# Patient Record
Sex: Male | Born: 1960 | Race: White | Hispanic: No | Marital: Married | Smoking: Never smoker
Health system: Southern US, Community
[De-identification: ages and names within clinical notes are randomized; demographics above are authoritative.]

## PROBLEM LIST (undated history)

## (undated) DIAGNOSIS — K219 Gastro-esophageal reflux disease without esophagitis: Secondary | ICD-10-CM

## (undated) DIAGNOSIS — R2681 Unsteadiness on feet: Secondary | ICD-10-CM

## (undated) DIAGNOSIS — M549 Dorsalgia, unspecified: Secondary | ICD-10-CM

## (undated) DIAGNOSIS — J309 Allergic rhinitis, unspecified: Secondary | ICD-10-CM

## (undated) DIAGNOSIS — K449 Diaphragmatic hernia without obstruction or gangrene: Secondary | ICD-10-CM

## (undated) DIAGNOSIS — R739 Hyperglycemia, unspecified: Secondary | ICD-10-CM

## (undated) DIAGNOSIS — G8929 Other chronic pain: Secondary | ICD-10-CM

## (undated) DIAGNOSIS — K649 Unspecified hemorrhoids: Secondary | ICD-10-CM

## (undated) DIAGNOSIS — I10 Essential (primary) hypertension: Secondary | ICD-10-CM

## (undated) DIAGNOSIS — R609 Edema, unspecified: Secondary | ICD-10-CM

## (undated) DIAGNOSIS — J301 Allergic rhinitis due to pollen: Secondary | ICD-10-CM

## (undated) HISTORY — DX: Allergic rhinitis due to pollen: J30.1

## (undated) HISTORY — PX: BACK SURGERY: SHX140

## (undated) HISTORY — DX: Hyperglycemia, unspecified: R73.9

## (undated) HISTORY — DX: Essential (primary) hypertension: I10

## (undated) HISTORY — DX: Other chronic pain: G89.29

## (undated) HISTORY — DX: Allergic rhinitis, unspecified: J30.9

## (undated) HISTORY — DX: Diaphragmatic hernia without obstruction or gangrene: K44.9

## (undated) HISTORY — DX: Morbid (severe) obesity due to excess calories: E66.01

## (undated) HISTORY — DX: Edema, unspecified: R60.9

## (undated) HISTORY — DX: Unsteadiness on feet: R26.81

## (undated) HISTORY — DX: Dorsalgia, unspecified: M54.9

## (undated) HISTORY — DX: Gastro-esophageal reflux disease without esophagitis: K21.9

## (undated) HISTORY — DX: Unspecified hemorrhoids: K64.9

---

## 2002-10-17 ENCOUNTER — Ambulatory Visit (HOSPITAL_COMMUNITY): Admission: RE | Admit: 2002-10-17 | Discharge: 2002-10-17 | Payer: Self-pay | Admitting: Family Medicine

## 2002-10-17 ENCOUNTER — Encounter: Payer: Self-pay | Admitting: Family Medicine

## 2004-06-21 ENCOUNTER — Ambulatory Visit: Payer: Self-pay | Admitting: *Deleted

## 2004-06-21 ENCOUNTER — Inpatient Hospital Stay (HOSPITAL_COMMUNITY): Admission: EM | Admit: 2004-06-21 | Discharge: 2004-06-25 | Payer: Self-pay | Admitting: Emergency Medicine

## 2004-06-22 ENCOUNTER — Ambulatory Visit: Payer: Self-pay | Admitting: *Deleted

## 2004-06-24 ENCOUNTER — Ambulatory Visit: Payer: Self-pay | Admitting: Cardiology

## 2004-07-08 ENCOUNTER — Ambulatory Visit (HOSPITAL_COMMUNITY): Admission: RE | Admit: 2004-07-08 | Discharge: 2004-07-08 | Payer: Self-pay | Admitting: Family Medicine

## 2009-03-05 ENCOUNTER — Encounter (INDEPENDENT_AMBULATORY_CARE_PROVIDER_SITE_OTHER): Payer: Self-pay | Admitting: *Deleted

## 2009-03-19 ENCOUNTER — Ambulatory Visit: Payer: Self-pay | Admitting: Internal Medicine

## 2009-03-19 DIAGNOSIS — K921 Melena: Secondary | ICD-10-CM | POA: Insufficient documentation

## 2009-03-19 DIAGNOSIS — K219 Gastro-esophageal reflux disease without esophagitis: Secondary | ICD-10-CM | POA: Insufficient documentation

## 2009-03-19 DIAGNOSIS — R1900 Intra-abdominal and pelvic swelling, mass and lump, unspecified site: Secondary | ICD-10-CM

## 2009-03-31 ENCOUNTER — Ambulatory Visit (HOSPITAL_COMMUNITY): Admission: RE | Admit: 2009-03-31 | Discharge: 2009-03-31 | Payer: Self-pay | Admitting: Internal Medicine

## 2009-03-31 ENCOUNTER — Telehealth (INDEPENDENT_AMBULATORY_CARE_PROVIDER_SITE_OTHER): Payer: Self-pay

## 2009-03-31 ENCOUNTER — Ambulatory Visit: Payer: Self-pay | Admitting: Internal Medicine

## 2009-04-05 ENCOUNTER — Ambulatory Visit (HOSPITAL_COMMUNITY): Admission: RE | Admit: 2009-04-05 | Discharge: 2009-04-05 | Payer: Self-pay | Admitting: Internal Medicine

## 2009-04-06 ENCOUNTER — Encounter: Payer: Self-pay | Admitting: Internal Medicine

## 2009-04-07 ENCOUNTER — Telehealth (INDEPENDENT_AMBULATORY_CARE_PROVIDER_SITE_OTHER): Payer: Self-pay

## 2010-06-16 ENCOUNTER — Encounter: Admission: RE | Admit: 2010-06-16 | Discharge: 2010-06-16 | Payer: Self-pay | Admitting: Internal Medicine

## 2010-08-09 ENCOUNTER — Encounter
Admission: RE | Admit: 2010-08-09 | Discharge: 2010-09-06 | Payer: Self-pay | Source: Home / Self Care | Attending: Internal Medicine | Admitting: Internal Medicine

## 2010-10-04 ENCOUNTER — Ambulatory Visit: Payer: Self-pay | Admitting: Dietician

## 2010-10-11 ENCOUNTER — Ambulatory Visit: Payer: Self-pay | Admitting: Dietician

## 2010-12-20 NOTE — Op Note (Signed)
NAME:  Jesse Meyers, Jesse Meyers                ACCOUNT NO.:  [id number]   MEDICAL RECORD NO.:  [id number]          PATIENT TYPE:  AMB   LOCATION:  DAY                           FACILITY:  [facility]   PHYSICIAN:  R. Roetta Sessions, M.D. DATE OF BIRTH:  [birth year]   DATE OF PROCEDURE:  03/31/2009  DATE OF DISCHARGE:                               OPERATIVE REPORT   PROCEDURE:  Diagnostic colonoscopy.   INDICATIONS FOR PROCEDURE:  [name], now  being evaluated for hematochezia in the setting of constipation.  Colonoscopy is now being done.  Risks, benefits, alternatives, and  limitations have been discussed, questions answered.  He has never had  his lower GI tract evaluated.  Family history is negative for colon  cancer.   Please note, the patient has an abdominal wall mass noted in the office.   Indeed, [name] does have a somewhat ill-defined abdominal wall mass-  like firmness localized just above the umbilicus.  It is not reducible  and it is nontender to palpation.  His abdominal girth makes further  characterization difficult.   PROCEDURE NOTE:  O2 saturation, blood pressure, pulse, and respirations  were monitored throughout the entire procedure.  Conscious sedation:  Versed 5 mg IV, Demerol 100 mg IV in divided doses.  Instrument:  Pentax  video chip system.   FINDINGS:  Digital rectal exam revealed no abnormalities.  Endoscopic  findings:  Prep was adequate.  Colon:  Colonic mucosa was surveyed from  the rectosigmoid junction through the left, transverse, right colon to  the appendiceal orifice, ileocecal valve, and cecum.  These structures  were well seen and photographed for the record.  From this level, the  scope was slowly and cautiously withdrawn.  All previous mentioned  mucosal surfaces were again seen.  The colonic mucosa appeared normal.  The scope was pulled down into the rectum, where thorough examination of  rectal mucosa, including  retroflexed view of the anal verge and en face  view of the anal canal demonstrated friable anal canal tissue and  minimal hemorrhoids only.  The patient tolerated the procedure well.  Cecal withdrawal time 8 minutes.   IMPRESSION:  Minimal anal canal hemorrhoids, friable anal canal,  otherwise normal rectum and colon.   RECOMMENDATIONS:  1. Daily Metamucil or Citrucel fiber supplement, MiraLax 17 grams      orally at bedtime p.r.n. constipation.  2. Ten-day course of Anusol HC suppositories 1 per rectum at bedtime.  3. Pursue abdominal wall mass further.  Will obtain a CT scan.  Will      need to be in a scanner that can accommodate a 382-pound male (BMI      58).  Further recommendations to follow.      Jonathon Bellows, M.D.  Electronically Signed     RMR/MEDQ  D:  03/31/2009  T:  03/31/2009  Job:  [id number]   cc:   Bennett Scrape, MD  Fax: [phone]

## 2010-12-23 NOTE — Discharge Summary (Signed)
NAME:  Jesse Meyers, Jesse Meyers NO.:  [id number]   MEDICAL RECORD NO.:  [id number]          PATIENT TYPE:  INP   LOCATION:  A222                          FACILITY:  [facility]   PHYSICIAN:  Mila Homer. Sudie Bailey, M.D.DATE OF BIRTH:  Aug 11, 1960   DATE OF ADMISSION:  06/21/2004  DATE OF DISCHARGE:  11/19/2005LH                                 DISCHARGE SUMMARY   ADDENDUM:  This patient had a normal dobutamine Cardiolite study and was  slated to go to [facility] for further review including cardiac  catheterization.  Before he left, I had Dr. Upton Bing, cardiologist,  review his entire record.  He felt that the patient's Cardiolite study was  poor quality, and his body habitus may have explained what appeared to be an  IMI.  Further, he had a cardiac echocardiogram Friday, November 18.  Today,  he is due to be transferred, and Dr. Dietrich Pates told me this is of  surprisingly good quality.  There was no sign of any wall-motion  abnormality on this echocardiogram.  Further discussion with the patient  convinced Dr. Dietrich Pates of the fact that he did have an asthma attack, just  more severe than usual.   FINAL DISCHARGE DIAGNOSES:  1.  Chest pain, probably secondary to severe asthma attack.  2.  Asthma.  3.  Morbid obesity.   FOLLOWUP:  He is due for follow up in the office and knows if he has any  further chest pain, to come to me immediately, or if it night or weekend, he  will be seen by whoever is on call for me, or to be seen in the [facility]  emergency room.      SDK/MEDQ  D:  06/25/2004  T:  06/25/2004  Job:  [id number]

## 2010-12-23 NOTE — Procedures (Signed)
NAME:  Jesse Meyers, DIA NO.:  [id number]   MEDICAL RECORD NO.:  [id number]          PATIENT TYPE:  INP   LOCATION:  A222                          FACILITY:  [facility]   PHYSICIAN:  Vida Roller, M.D.   DATE OF BIRTH:  09-28-1960   DATE OF PROCEDURE:  06/22/2004  DATE OF DISCHARGE:                                    STRESS TEST   INDICATIONS FOR PROCEDURE:  [name] is a 50 year old male with no known  coronary artery disease with atypical chest discomfort.  Cardiac risk  factors include unknown lipid status, questionable family history, and  obesity.   BASELINE DATA:  Sinus rhythm at 67 beats per minute with nonspecific ST  abnormalities.  Blood pressure was 122/68.   Dobutamine was infused up to 40 mcg.  The patient was given 0.5 mg of  Atropine IV with the addition of isometric exercise to reach his target  heart rate.  Maximum heart rate achieved was 168 beats per minute, which is  94% of predicted maximum.  Maximum blood pressure was 156/86.  The patient  reported nausea and flush feeling during infusion of Dobutamine which  resolved in recovery with decrease in heart rate.  EKG revealed no ischemic  changes, no arrhythmias were noted.  The patient was given 5 mg of IV  Lopressor secondary to nausea and flushing.  This decreased his heart rate  considerably and relieved his symptoms.   Final images and results are pending M.D. review.  This is a two day study.     Amy   AB/MEDQ  D:  06/22/2004  T:  06/22/2004  Job:  [id number]

## 2010-12-23 NOTE — Consult Note (Signed)
NAME:  Jesse Meyers, Jesse Meyers NO.:  [id number]   MEDICAL RECORD NO.:  [id number]          PATIENT TYPE:  INP   LOCATION:  A222                          FACILITY:  [facility]   PHYSICIAN:  Vida Roller, M.D.   DATE OF BIRTH:  14-Feb-1961   DATE OF CONSULTATION:  06/21/2004  DATE OF DISCHARGE:                                   CONSULTATION   PRIMARY CARE PHYSICIAN:  Dr. Mila Homer. [place].   HISTORY OF PRESENT ILLNESS:  Jesse Meyers is a 50 year old man who has asthma  and a history of severe back injury culminating in surgery in 1993 who  presented to the emergency department at [facility] complaining of  shortness of breath and wheezing associated with some chest pain that  radiated through to his back.  He reports the discomfort to be pleuritic in  nature.  It started about 3:30 in the morning, associated with wheezing, and  he states that it radiates sort of from the anterior portion of his chest  straight back.  He also reports that it will get worse when he exerts  himself, and it gets worse when he leans forward.  He took a sublingual  nitroglycerin and felt better in the emergency department and occasionally  this discomfort will intermittently come back.  He was having a little bit  of the discomfort when he was initially evaluated, but now he is without  discomfort.   PAST MEDICAL HISTORY:  He has a history of asthma which is thought to be  secondary to allergies, and back surgery, and there is a question of whether  he has hypertension, although this has not been treated.   ALLERGIES:  He is not allergic to any medications.   MEDICATIONS:  Benadryl and Tylenol as an outpatient.   SOCIAL HISTORY:  He lives in [place] with his wife.  He is disabled  secondary to his back.  He is married.  He has a son who had some heart  problems at birth.  He does not smoke, drink or use drugs.   FAMILY HISTORY:  His mother died of emphysema in her 93's.  Father is  alive  at age 80 and does not have any heart disease.  He has one brother who is in  his 77's and is healthy.   REVIEW OF SYSTEMS:  Generally negative except for morbid obesity which  causes him to have some challenges when he sleeps including requiring the  need to be propped up when he sleeps with a couple of pillows.  I do not  think this is frank orthopnea; however.   PHYSICAL EXAMINATION:  GENERAL:  He is an obese white male, in no apparent  distress.  VITAL SIGNS:  He is alert and oriented x4.  His blood pressure is 110/68.  Respiratory rate 14, pulse 72.  He weighs 360 pounds.  He is saturating 98%  on room air.  HEENT:  Examination of the head, ears, eyes, nose and throat is  unremarkable.  NECK:  Supple.  There is no jugular venous distension or carotid  bruits.  CHEST:  He has sort of diffuse wheezes, but reasonably good air movement.  He does have some tenderness to palpation on his chest wall in the area that  he has the discomfort.  His cardiac exam is regular with no significant  murmurs.  ABDOMEN:  Soft, obese, nontender.  EXTREMITIES:  Lower extremities are without clubbing, cyanosis or edema.  NEUROLOGIC:  Exam is nonfocal.   LABORATORY DATA:  Chest x-ray shows cardiomegaly, otherwise unremarkable.  Electrocardiogram, sinus rhythm with a mild right axis deviation.  Normal  intervals, normal axes, no ST-T wave changes.  White blood cell count 9.0,  H&H 14 and 43, platelet count 264,000.  Sodium 136, potassium 3.9, chloride  103, bicarbonate 26, BUN 18, creatinine 0.8, and his blood sugar is 92.  Single set of cardiac enzymes is not consistent with acute myocardial  infarction.  Three sets of point of care enzymes are negative for coronary  disease.  His B type  natriuretic peptide is normal.  His D-Dimer is normal.   ASSESSMENT:  This is a gentleman with chest discomfort which is very  atypical for coronary disease, reactive airway disease and morbid obesity.   Obviously, the obesity makes evaluation of his cardiac situation challenging  as does the reactive airway disease.  He will need his reactive airways  disease aggressively treated, and we will leave that to his primary care  physician to provide. I think he needs his cardiac enzymes completed as  cycle of three sets.  If that is negative, I think probably a dobutamine  Cardiolite is the way to go for this gentleman to risk stratify him.  I have  talked to him about weight loss, and we will move forward with this  evaluation from here.     Trey Paula   JH/MEDQ  D:  06/21/2004  T:  06/21/2004  Job:  [id number]

## 2010-12-23 NOTE — Group Therapy Note (Signed)
NAME:  Jesse Meyers, Jesse Meyers NO.:  [id number]   MEDICAL RECORD NO.:  [id number]          PATIENT TYPE:  INP   LOCATION:  A222                          FACILITY:  [facility]   PHYSICIAN:  Mila Homer. Sudie Bailey, M.D.DATE OF BIRTH:  09/18/1960   DATE OF PROCEDURE:  DATE OF DISCHARGE:                                   PROGRESS NOTE   SUBJECTIVE:  He has had no further chest pain.  He is feeling well.   PHYSICAL EXAMINATION:  VITAL SIGNS:  Temperature 97.5, pulse 63, respiratory  rate 24, blood pressure 66.  HEART:  Regular rhythm without murmur, rate of 70.  Heart sounds distant.  LUNGS:  Clear throughout but lung sounds distant.  ABDOMEN:  Morbidly obese without tenderness.   LABORATORY DATA:  Lipid profile showed a cholesterol of 166, triglyceride  110, HDL 42, LDL 102.   ASSESSMENT:  1.  Chest pain, improved, question etiology.  2. Morbid obesity.   PLAN:  He is having the rest of his dobutamine stress test today.  If this  is negative he can go home with further workup outpatient.      SDK/MEDQ  D:  06/23/2004  T:  06/23/2004  Job:  [id number]

## 2010-12-23 NOTE — H&P (Signed)
NAME:  Jesse Meyers, Jesse Meyers NO.:  [id number]   MEDICAL RECORD NO.:  [id number]          PATIENT TYPE:  INP   LOCATION:  A222                          FACILITY:  [facility]   PHYSICIAN:  Mila Homer. Sudie Bailey, M.D.DATE OF BIRTH:  Dec 30, 1960   DATE OF ADMISSION:  06/21/2004  DATE OF DISCHARGE:  LH                                HISTORY & PHYSICAL   This 50 year old woke up from sleep around 3:30 a.m. today with an anterior  chest pressure/discomfort, this persisted, so he came to the emergency room  and received nitroglycerin sublingually which totally cleared up the  discomfort.   He has a history of:  1.  Asthma.  2.  Back surgery.  3.  Obesity.   This is the first time he has had pain like this.  He would get  uncomfortable with his asthma, but the discomfort he had was different than  the discomfort he is having with this.  Admission white cell count was  9,000, H&H 14.3 and 42.6, platelet count 264,000.  His MET-7 was essentially  normal.  His cardiac enzymes were negative.  D-dimmer normal.  He had three  EKGs performed:  the first one at 10:43 a.m. which was felt to be  essentially normal; a second was done at 12:43 p.m. and did show abnormal R  wave progression with what appeared to be Q waves in V2; his third EKG, done  at 1525, was essentially normal except for a sinus arrhythmia.   PHYSICAL EXAMINATION:  GENERAL:  Showed a pleasant, morbidly obese, middle-  aged man who was in no acute distress at the time I examined him later in  the afternoon.  He was well developed.  VITAL SIGNS:  Admission temperature was 97.6, his blood pressure 120/65  __________  cuff, the pulse was 79, and respiratory rate was 16.  O2 sat was  96% on room air.  MENTAL STATUS:  He appeared to be oriented and alert.  HEART:  Regular rhythm, rate of 70.  Heart sounds very distant.  LUNGS:  Clear throughout but distant lung sounds.  ABDOMEN:  Morbidly obese without hepatosplenomegaly or  mass.  There was no  upper abdominal tenderness.  EXTREMITIES:  Trace edema at the ankles.  SKIN:  Color was good.   ADMISSION DIAGNOSES:  1.  Chest pain possibly cardiac in origin.  2.  Morbid obesity.  3.  History of asthma.  4.  History of back surgery.   PLAN OF TREATMENT:  1.  Cardiac workup through [facility].  2.  He is going to be started on ECASA 81 mg every day.  3.  TSH is pending.  4.  He has had an IV of normal saline KVO and on a cardiac monitor.  5.  We will add Protonix 40 mg p.o. every day.  6.  And due for a dobutamine two day study starting tomorrow.  7.  He has been seen by Dr. Dorethea Clan of [facility].      SDK/MEDQ  D:  06/21/2004  T:  06/21/2004  Job:  [id number]

## 2010-12-23 NOTE — Procedures (Signed)
NAME:  Jesse Meyers, Jesse Meyers NO.:  [id number]   MEDICAL RECORD NO.:  [id number]          PATIENT TYPE:  INP   LOCATION:  A222                          FACILITY:  [facility]   PHYSICIAN:  [employer] Bing, M.D.  DATE OF BIRTH:  02-28-1961   DATE OF PROCEDURE:  06/24/2004  DATE OF DISCHARGE:                                  ECHOCARDIOGRAM   REFERRING PHYSICIAN:  Dr. Sudie Bailey.   CLINICAL DATA:  A 50 year old gentleman with chest pain and abnormal  Cardiolite study.   M-MODE:  Aorta 2.8, left atrium 4.9, septum 1.6, posterior wall 1.4, LV  diastole 5.1, LV systole 3.5.   RESULTS:  1.  Technically adequate echocardiographic study.  2.  Marginal left atrial enlargement.  Normal right atrium and right      ventricle.  3.  Normal aortic, mitral, and tricuspid valves.  Pulmonic valve not well      seen, but also appears normal.  4.  Normal Doppler examination.  5.  Normal internal dimension of the left ventricle;  mild to moderate      hypertrophy with disproportionate involvement of the septum.  Normal      regional and global left ventricular systolic function.     Robe   RR/MEDQ  D:  06/24/2004  T:  06/24/2004  Job:  [id number]

## 2010-12-27 NOTE — Discharge Summary (Signed)
NAME:  Jesse Meyers, Jesse Meyers NO.:  [id number]   MEDICAL RECORD NO.:  [id number]          PATIENT TYPE:  INP   LOCATION:  A222                          FACILITY:  [facility]   PHYSICIAN:  Mila Homer. Sudie Bailey, M.D.DATE OF BIRTH:  30-Jun-1961   DATE OF ADMISSION:  06/21/2004  DATE OF DISCHARGE:  11/18/2005LH                                 DISCHARGE SUMMARY   HISTORY OF PRESENT ILLNESS:  A 50 year old who was admitted to the hospital  with chest pain which resolved with nitroglycerin.  He had a benign four day  hospitalization extending from June 21, 2004, to June 24, 2004.   Vital signs remained stable and he was chest pain free during the  hospitalization.   LABORATORY DATA:  His admission CBC, BMP, D-dimer, cardiac markers x3, TSH,  beta natruretic peptide were normal.  His lipid profile showed a total  cholesterol of 166, triglycerides 110, HDL 42, LDL 102.   His admission EKG was normal.  The one two hours later showed what appeared  to be Q's in V2, but this resolved three hours later.  Monitor was normal.   HOSPITAL COURSE:  He was admitted to the hospital, on a cardiac monitor with  vitals q.4h., clear liquid diet, IV normal saline KVO, was seen by [facility].  He had a Dobutamine Cardiolite two day study which was of poor  quality, but significant and moderate sized inferior MI and a possible small  area of anterior ischemia.   Results were discussed with the patient, his wife, and also independently  with the cardiologist on duty, Dr. Dietrich Pates.  This study was poor, but the  patient's dramatic response to nitroglycerin, his risk factors indicate he  would require further workup with cardiac catheterization to be planned at  [facility] in the near future.   FINAL DISCHARGE DIAGNOSES:  1.  Chest pain, possibly due to ischemic heart disease.  2.  Morbid obesity.   DISPOSITION:  As above.   FOLLOWUP:  In our office.      SDK/MEDQ  D:   06/24/2004  T:  06/24/2004  Job:  [id number]

## 2011-06-03 ENCOUNTER — Emergency Department (HOSPITAL_COMMUNITY): Payer: Medicare Other

## 2011-06-03 ENCOUNTER — Encounter: Payer: Self-pay | Admitting: Emergency Medicine

## 2011-06-03 ENCOUNTER — Emergency Department (HOSPITAL_COMMUNITY)
Admission: EM | Admit: 2011-06-03 | Discharge: 2011-06-03 | Disposition: A | Discharge status: Home / Self Care | Payer: Medicare Other | Attending: Emergency Medicine | Admitting: Emergency Medicine

## 2011-06-03 DIAGNOSIS — M545 Low back pain, unspecified: Secondary | ICD-10-CM | POA: Insufficient documentation

## 2011-06-03 DIAGNOSIS — M549 Dorsalgia, unspecified: Secondary | ICD-10-CM

## 2011-06-03 LAB — URINALYSIS, ROUTINE W REFLEX MICROSCOPIC
Bilirubin Urine: NEGATIVE
Glucose, UA: NEGATIVE mg/dL
Ketones, ur: NEGATIVE mg/dL
Leukocytes, UA: NEGATIVE
Nitrite: NEGATIVE
Protein, ur: NEGATIVE mg/dL
Specific Gravity, Urine: 1.02 (ref 1.005–1.030)
Urobilinogen, UA: 0.2 mg/dL (ref 0.0–1.0)
pH: 6 (ref 5.0–8.0)

## 2011-06-03 LAB — URINE MICROSCOPIC-ADD ON

## 2011-06-03 MED ORDER — OXYCODONE-ACETAMINOPHEN 5-325 MG PO TABS: 1.0000 | ORAL_TABLET | ORAL | Status: AC | PRN

## 2011-06-03 NOTE — ED Notes (Signed)
Pt c/o pain to left lower back. Pt states he was getting out of his truck on Monday and twisted his back.

## 2011-06-03 NOTE — ED Provider Notes (Signed)
History     CSN: [id number] Arrival date & time: 06/03/2011 11:09 AM   First MD Initiated Contact with Patient 06/03/11 1056      Chief Complaint  Patient presents with  . Back Pain    (Consider location/radiation/quality/duration/timing/severity/associated sxs/prior treatment) HPI Comments: Patient c/o left lower back pain for 5 days.  States the pain began when he stepped out of his truck and twisted his back.  Felt a sharp pain to his back.  He denies fall.  Was seen at the local urgent care and given prednisone and muscle relaxer to which he states is not helping the pain.  He denies dysuria, incontinence, numbness or weakness.  Patient is a 50 y.o. male presenting with back pain. The history is provided by the patient.  Back Pain  This is a new problem. The current episode started more than 2 days ago. The problem occurs constantly. The problem has not changed since onset.The pain is associated with twisting. The pain is present in the lumbar spine and sacro-iliac joint. The pain radiates to the left thigh (left buttocks). The pain is moderate. The symptoms are aggravated by bending, twisting and certain positions. The pain is the same all the time. Pertinent negatives include no chest pain, no fever, no numbness, no abdominal pain, no abdominal swelling, no bowel incontinence, no perianal numbness, no dysuria and no weakness. He has tried muscle relaxants and NSAIDs for the symptoms. The treatment provided no relief. Risk factors include obesity.    History reviewed. No pertinent past medical history.  Past Surgical History  Procedure Date  . Back surgery     History reviewed. No pertinent family history.  History  Substance Use Topics  . Smoking status: Never Smoker   . Smokeless tobacco: Not on file  . Alcohol Use: No      Review of Systems  Constitutional: Negative for fever, chills and fatigue.  HENT: Negative for sore throat, trouble swallowing, neck pain and neck  stiffness.   Respiratory: Negative for shortness of breath.   Cardiovascular: Negative for chest pain and palpitations.  Gastrointestinal: Negative for nausea, vomiting, abdominal pain, blood in stool and bowel incontinence.  Genitourinary: Negative for dysuria, hematuria, flank pain, penile pain and testicular pain.  Musculoskeletal: Positive for back pain. Negative for myalgias, joint swelling, arthralgias and gait problem.  Skin: Negative for rash.  Neurological: Negative for dizziness, weakness and numbness.  Hematological: Does not bruise/bleed easily.  All other systems reviewed and are negative.    Allergies  Review of patient's allergies indicates no known allergies.  Home Medications   Current Outpatient Rx  Name Route Sig Dispense Refill  . CYCLOBENZAPRINE HCL 10 MG PO TABS Oral Take 10 mg by mouth 3 (three) times daily.      . IBUPROFEN 200 MG PO TABS Oral Take 200-800 mg by mouth every 6 (six) hours as needed. For back pain     . PREDNISONE (PAK) 10 MG PO TABS Oral Take 10-60 mg by mouth daily. Started dose pack 06/02/11       BP 124/64  Pulse 69  Temp(Src) 98.3 F (36.8 C) (Oral)  Resp 18  Ht 5\' 8"  (1.727 m)  Wt 341 lb (154.677 kg)  BMI 51.85 kg/m2  SpO2 99%  Physical Exam  Nursing note and vitals reviewed. Constitutional: He is oriented to person, place, and time. He appears well-developed and well-nourished. No distress.  HENT:  Head: Normocephalic and atraumatic.  Mouth/Throat: Oropharynx is clear and moist.  Neck: Normal range of motion. Neck supple.  Cardiovascular: Normal rate, regular rhythm and normal heart sounds.   Pulmonary/Chest: Effort normal and breath sounds normal. No respiratory distress. He exhibits no tenderness.  Abdominal: Soft. He exhibits no distension. There is no tenderness.  Musculoskeletal: Normal range of motion. He exhibits tenderness. He exhibits no edema.       Lumbar back: He exhibits tenderness. He exhibits normal range of  motion, no bony tenderness, no swelling, no edema, no deformity, no spasm and normal pulse.       ttp of the left lower lumbar paraspinal muscles and left SI joint space.  Well healed surgical scar over the lumbar spine  Lymphadenopathy:    He has no cervical adenopathy.  Neurological: He is alert and oriented to person, place, and time. No cranial nerve deficit or sensory deficit. He exhibits normal muscle tone. Coordination and gait normal.  Reflex Scores:      Patellar reflexes are 2+ on the right side and 2+ on the left side.      Achilles reflexes are 2+ on the right side and 2+ on the left side. Skin: Skin is warm and dry.    ED Course  Procedures (including critical care time)  Labs Reviewed  URINALYSIS, ROUTINE W REFLEX MICROSCOPIC - Abnormal; Notable for the following:    Hgb urine dipstick SMALL (*)    All other components within normal limits  URINE MICROSCOPIC-ADD ON   Dg Lumbar Spine Complete  06/03/2011  *RADIOLOGY REPORT*  Clinical Data: Low back pain x 5 days, prior lumbar surgery  LUMBAR SPINE - COMPLETE 4+ VIEW  Comparison: CT abdomen/pelvis dated 04/05/2009  Findings: There are five lumbar-type vertebral bodies.  No evidence of fracture or dislocation.  Vertebral body heights are maintained.  Multilevel degenerative changes.  Prior posterior lumbar fixation at L5-S1. Fracture of the left S1 pedicle screw, new.  IMPRESSION: Prior posterior lumbar fixation at L5-S1.  Fracture of the left S1 pedicle screw, new.  Original Report Authenticated By: Charline Bills, M.D.        MDM   Patient ambulated to the restroom w/o difficulty.  No focal neuro deficits on exam.  ttp of the left lumbar paraspinal muscles.     1:30 PM consulted Dr. Channing Mutters.  He states that the pedicle screw fx that was seen on todays x-ray is old.  He will see pt in his office on Monday for follow-up.    OUTPATIENT MEDICATIONS PRESCRIBED FROM THE ED:   New Prescriptions   OXYCODONE-ACETAMINOPHEN  (PERCOCET) 5-325 MG PER TABLET    Take 1 tablet by mouth every 4 (four) hours as needed for pain.       Marky Buresh L. [place], [place] 06/03/11 2052

## 2011-06-03 NOTE — ED Notes (Signed)
Pt a/ox4. Resp even and unlabored. NAD at this time.  D/C instructions reviewed with pt. Pt verbalized understanding. Rx reviewed as well. Pt ambulated to POV with steady gate.

## 2011-06-04 NOTE — ED Provider Notes (Signed)
Medical screening examination/treatment/procedure(s) were performed by non-physician practitioner and as supervising physician I was immediately available for consultation/collaboration.  Doug Sou, MD 06/04/11 [phone]

## 2011-07-19 ENCOUNTER — Other Ambulatory Visit: Payer: Self-pay | Admitting: Neurosurgery

## 2011-07-19 DIAGNOSIS — M4316 Spondylolisthesis, lumbar region: Secondary | ICD-10-CM

## 2011-07-25 ENCOUNTER — Ambulatory Visit
Admission: RE | Admit: 2011-07-25 | Discharge: 2011-07-25 | Disposition: A | Discharge status: Home / Self Care | Payer: Medicare Other | Source: Ambulatory Visit | Attending: Neurosurgery | Admitting: Neurosurgery

## 2011-07-25 DIAGNOSIS — M4316 Spondylolisthesis, lumbar region: Secondary | ICD-10-CM

## 2011-11-11 IMAGING — CR DG TIBIA/FIBULA 2V*L*
2 series · 2 of 2 positions shown · non-contrast
Comparison: None.

CLINICAL DATA: Left lower extremity pain and swelling.  No known
injury.

LEFT TIBIA AND FIBULA - 2 VIEW

[view not recorded (1 of 2)]
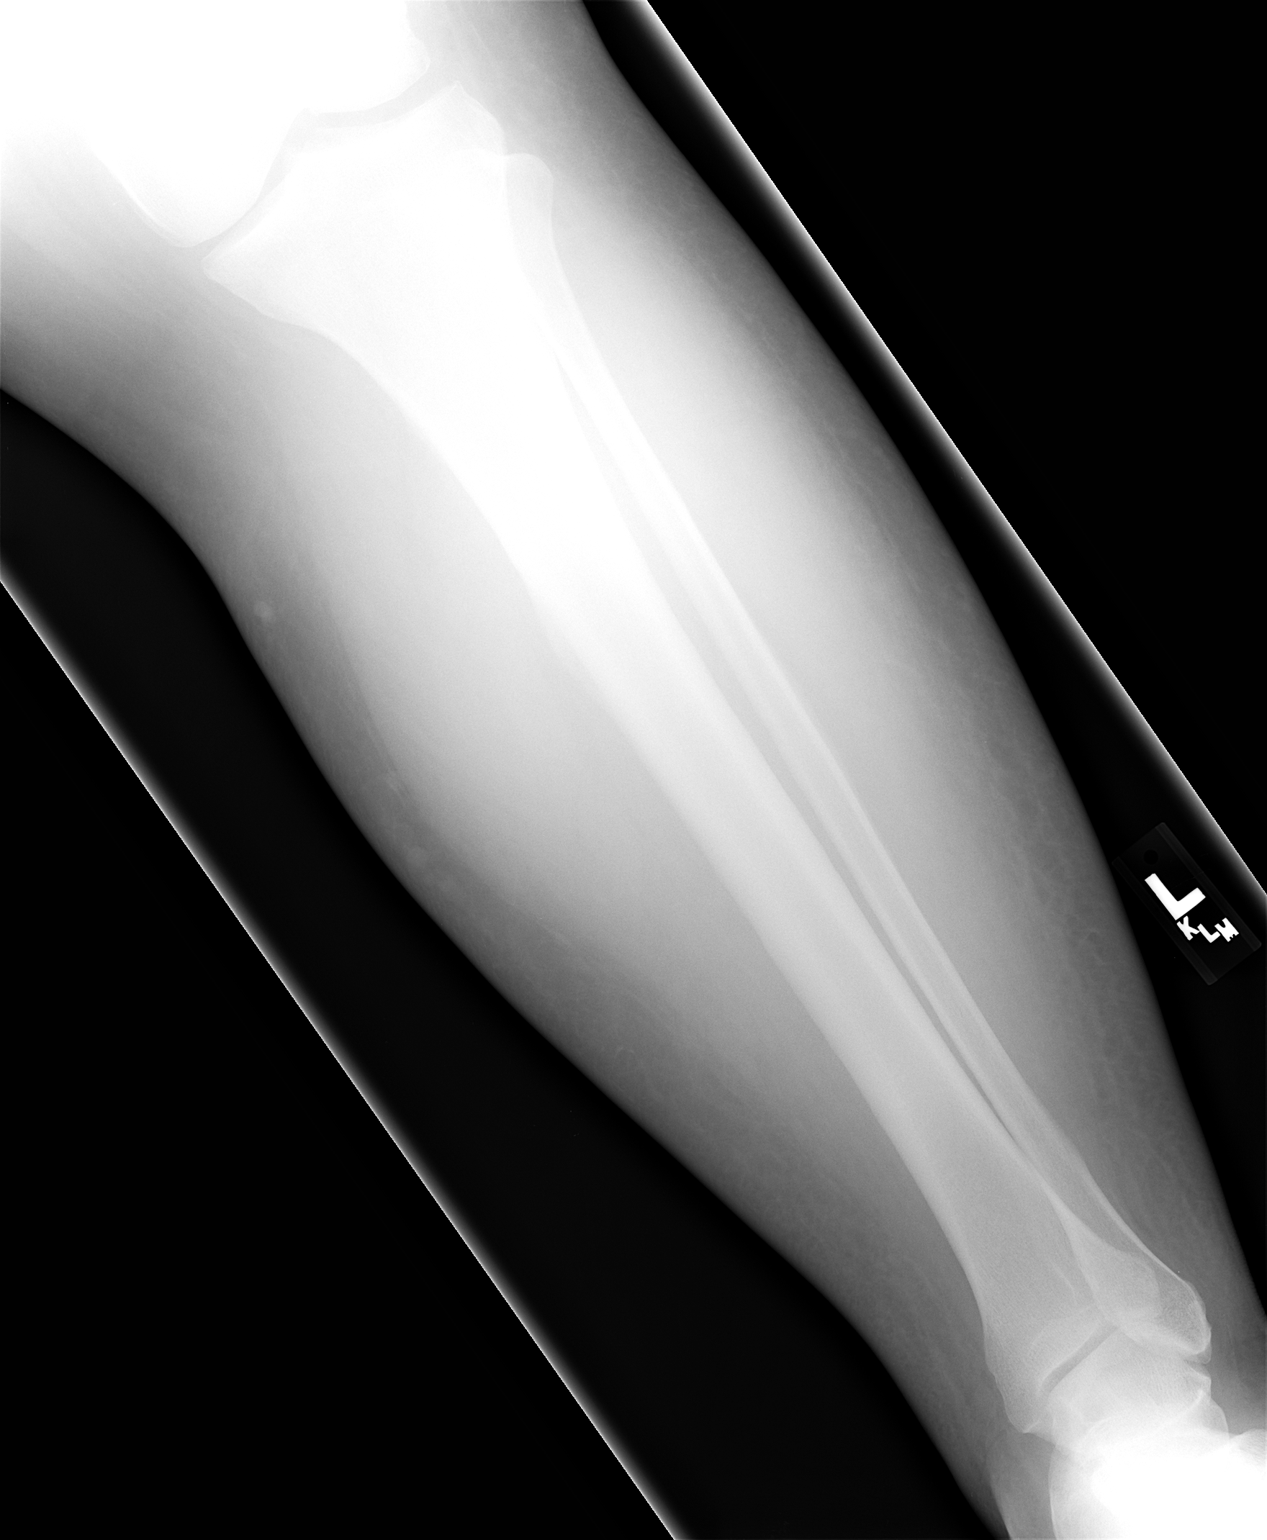

[view not recorded (2 of 2)]
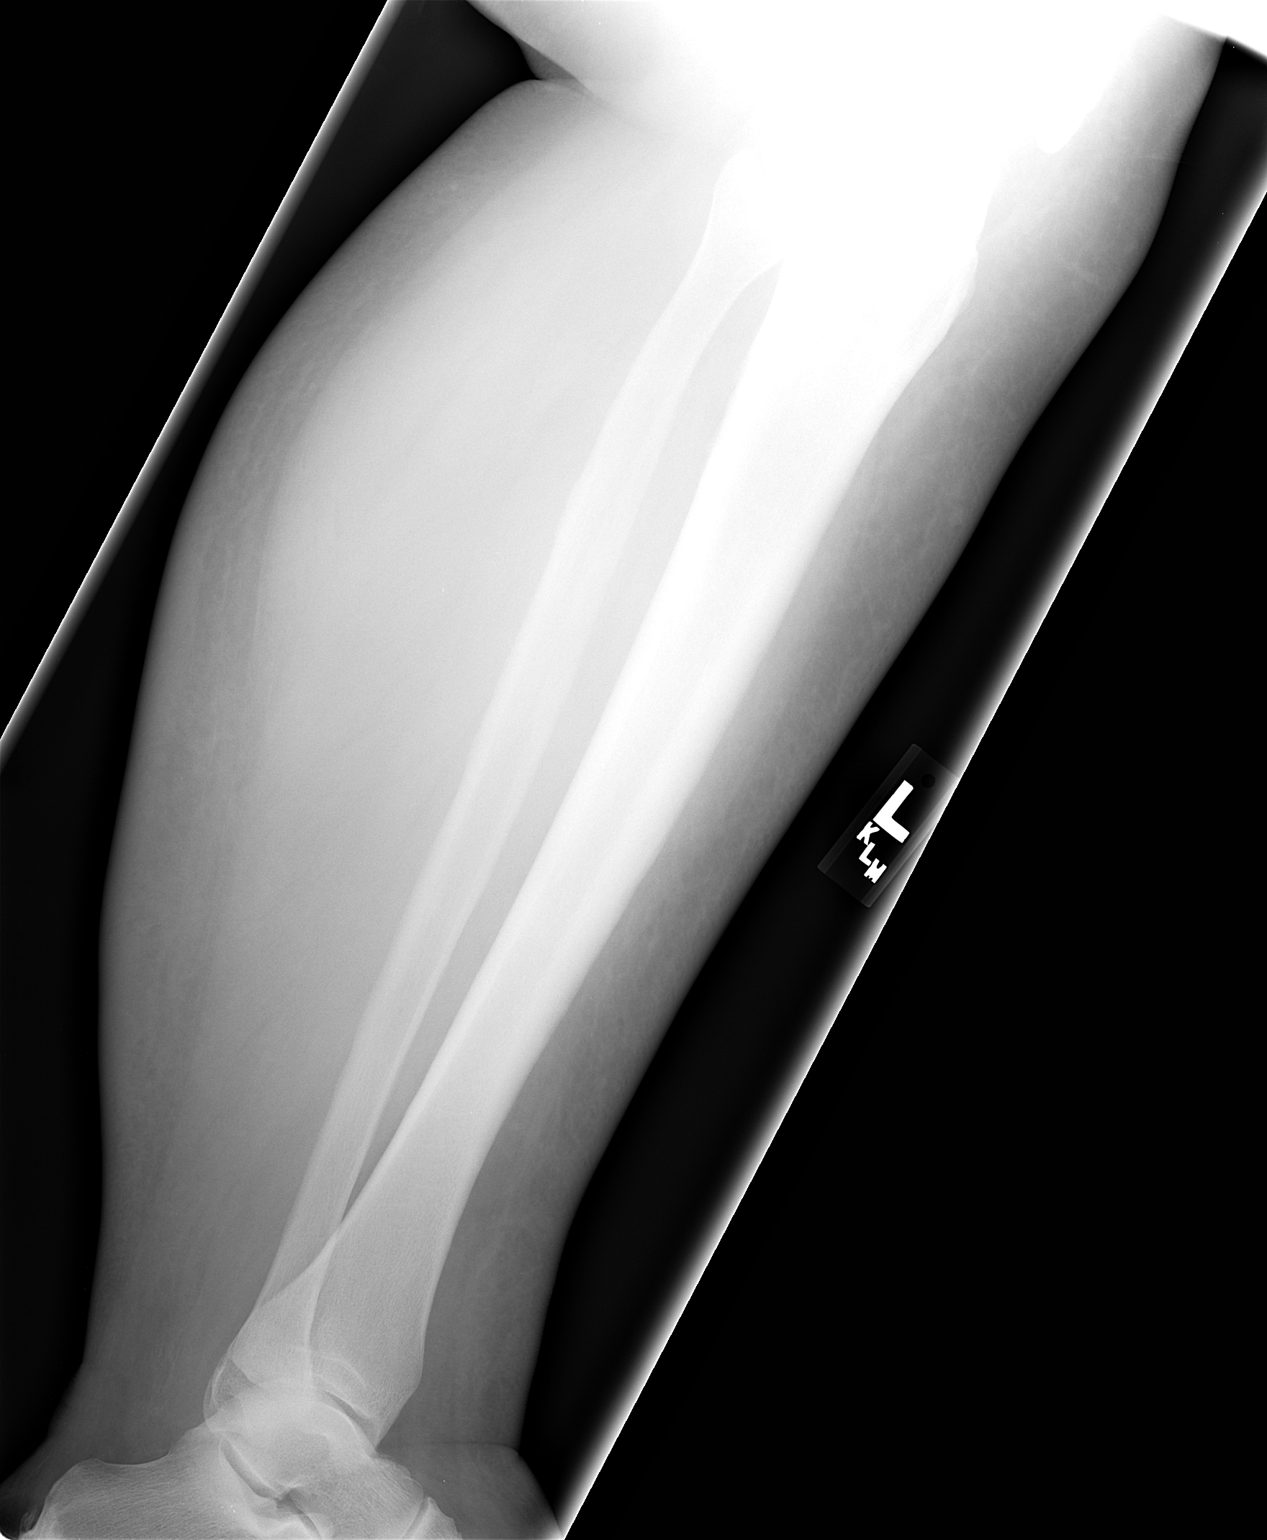

[2 of 2 positions shown; findings below may reference images not displayed]

FINDINGS: The soft tissues are diffusely prominent and possibly
swollen.  No soft tissue emphysema, foreign body, acute fracture,
dislocation or bone destruction is demonstrated.
IMPRESSION: Possible diffuse lower extremity soft tissue edema- nonspecific.
No osseous abnormality demonstrated.

## 2013-04-14 ENCOUNTER — Emergency Department (HOSPITAL_COMMUNITY): Payer: Medicare Other

## 2013-04-14 ENCOUNTER — Emergency Department (HOSPITAL_COMMUNITY)
Admission: EM | Admit: 2013-04-14 | Discharge: 2013-04-14 | Disposition: A | Discharge status: Home / Self Care | Payer: Medicare Other | Attending: Emergency Medicine | Admitting: Emergency Medicine

## 2013-04-14 ENCOUNTER — Encounter (HOSPITAL_COMMUNITY): Payer: Self-pay | Admitting: *Deleted

## 2013-04-14 DIAGNOSIS — J209 Acute bronchitis, unspecified: Secondary | ICD-10-CM | POA: Insufficient documentation

## 2013-04-14 DIAGNOSIS — R609 Edema, unspecified: Secondary | ICD-10-CM | POA: Insufficient documentation

## 2013-04-14 MED ORDER — PREDNISONE 50 MG PO TABS
60.0000 mg | ORAL_TABLET | Freq: Once | ORAL | Status: AC
  Administered 2013-04-14: 60 mg via ORAL
  Filled 2013-04-14: qty 1

## 2013-04-14 MED ORDER — ALBUTEROL SULFATE (5 MG/ML) 0.5% IN NEBU
2.5000 mg | INHALATION_SOLUTION | Freq: Once | RESPIRATORY_TRACT | Status: AC
  Administered 2013-04-14: 2.5 mg via RESPIRATORY_TRACT
  Filled 2013-04-14: qty 0.5

## 2013-04-14 MED ORDER — PREDNISONE 50 MG PO TABS: 50.0000 mg | ORAL_TABLET | Freq: Every day | ORAL | Status: DC

## 2013-04-14 MED ORDER — IPRATROPIUM BROMIDE 0.02 % IN SOLN
0.5000 mg | Freq: Once | RESPIRATORY_TRACT | Status: AC
  Administered 2013-04-14: 0.5 mg via RESPIRATORY_TRACT
  Filled 2013-04-14: qty 2.5

## 2013-04-14 MED ORDER — ALBUTEROL SULFATE HFA 108 (90 BASE) MCG/ACT IN AERS
2.0000 | INHALATION_SPRAY | RESPIRATORY_TRACT | Status: DC | PRN
  Filled 2013-04-14: qty 6.7

## 2013-04-14 NOTE — ED Notes (Signed)
C/o cold symptoms since Sat., felt chest feeling tight today

## 2013-04-14 NOTE — ED Provider Notes (Signed)
Scribed for Dione Booze, MD, the patient was seen in room APA10/APA10. This chart was scribed by Lewanda Rife, ED scribe. Patient's care was started at 1810  CSN: [id number]     Arrival date & time 04/14/13  1746 History   First MD Initiated Contact with Patient 04/14/13 1804     Chief Complaint  Patient presents with  . Wheezing   (Consider location/radiation/quality/duration/timing/severity/associated sxs/prior Treatment) The history is provided by the patient.   HPI Comments: Jesse Meyers is a 52 y.o. male who presents to the Emergency Department complaining of constant moderate "congestion in chest" onset today. Reports associated mild chills, shortness of breath, non-productive cough, and chronic edema in lower legs. Reports symptoms are exacerbated on exertion and alleviated by nothing. Denies associated fever, nausea, orthopnea, chest pain, and emesis. Denies smoking or drinking alcohol. Reports hx of similar symptoms. Denies having a PCP. History reviewed. No pertinent past medical history. Past Surgical History  Procedure Laterality Date  . Back surgery     No family history on file. History  Substance Use Topics  . Smoking status: Never Smoker   . Smokeless tobacco: Not on file  . Alcohol Use: No    Review of Systems  Respiratory: Positive for wheezing.    A complete 10 system review of systems was obtained and all systems are negative except as noted in the HPI and PMH.    Allergies  Review of patient's allergies indicates no known allergies.  Home Medications   Current Outpatient Rx  Name  Route  Sig  Dispense  Refill  . cyclobenzaprine (FLEXERIL) 10 MG tablet   Oral   Take 10 mg by mouth 3 (three) times daily.           Marland Kitchen ibuprofen (ADVIL,MOTRIN) 200 MG tablet   Oral   Take 200-800 mg by mouth every 6 (six) hours as needed. For back pain          . predniSONE (STERAPRED UNI-PAK) 10 MG tablet   Oral   Take 10-60 mg by mouth daily. Started dose  pack 06/02/11           BP 134/76  Pulse 86  Temp(Src) 99.7 F (37.6 C) (Oral)  Resp 24  Ht 5\' 4"  (1.626 m)  Wt 350 lb (158.759 kg)  BMI 60.05 kg/m2  SpO2 100% Physical Exam  Nursing note and vitals reviewed. Constitutional: He is oriented to person, place, and time. He appears well-developed and well-nourished. No distress.  Morbidly obese   HENT:  Head: Normocephalic and atraumatic.  Eyes: EOM are normal.  Neck: Neck supple. No tracheal deviation present.  Cardiovascular: Normal rate, regular rhythm and normal heart sounds.   Pulmonary/Chest: Effort normal. No respiratory distress. He has wheezes (mild diffuse ).  Abdominal: Soft.  Musculoskeletal: Normal range of motion. He exhibits edema (2 + lower extremities ).  Mild venous stasis changes in both legs  Neurological: He is alert and oriented to person, place, and time.  Skin: Skin is warm and dry.  Psychiatric: He has a normal mood and affect. His behavior is normal.    ED Course  Procedures (including critical care time)\ Medications  albuterol (PROVENTIL) (5 MG/ML) 0.5% nebulizer solution 2.5 mg (2.5 mg Nebulization Given 04/14/13 1821)  ipratropium (ATROVENT) nebulizer solution 0.5 mg (0.5 mg Nebulization Given 04/14/13 1821)  predniSONE (DELTASONE) tablet 60 mg (60 mg Oral Given 04/14/13 1822)   Imaging Review Dg Chest 2 View  04/14/2013   *RADIOLOGY REPORT*  Clinical Data: Wheezing.  Chest congestion  CHEST - 2 VIEW  Comparison: 07/08/2004  Findings: Heart size is normal.  No pleural effusion or edema. Mild plate-like atelectasis noted in the lung bases.  No airspace consolidation.  IMPRESSION:  1.  Bibasilar atelectasis. 2.  No pneumonia.   Original Report Authenticated By: Signa Kell, M.D.    MDM   1. Acute bronchitis    Wheezing and cough consistent with bronchitis. He'll be treated with albuterol with ipratropium and given a dose of prednisone. Chest x-ray will be obtained to rule out pneumonia.  7:16  PM He got significant relief with albuterol with ipratropium. Chest x-ray does not show pneumonia. On exam, he still has scattered wheezes and breathing treatment will be repeated.  8:02 PM Following a second nebulizer treatment, lungs are completely clear and patient states he feels like he is back to normal. He is discharged with an albuterol inhaler and a prescription for prednisone.   I personally performed the services described in this documentation, which was scribed in my presence. The recorded information has been reviewed and is accurate.     Dione Booze, MD 04/14/13 2003

## 2013-04-14 NOTE — ED Notes (Signed)
Chest congestion with wheezing and non-productive cough x 2-3 days.  No audible wheezing in triage.

## 2013-04-14 NOTE — ED Notes (Signed)
Respiratory paged and aware of neb treatment

## 2013-10-14 ENCOUNTER — Encounter: Payer: Self-pay | Admitting: *Deleted

## 2013-10-15 ENCOUNTER — Ambulatory Visit: Payer: Self-pay | Admitting: Nurse Practitioner

## 2014-01-12 ENCOUNTER — Ambulatory Visit: Payer: Self-pay | Admitting: Internal Medicine

## 2014-05-11 ENCOUNTER — Ambulatory Visit: Payer: Self-pay | Admitting: Internal Medicine

## 2014-08-20 DIAGNOSIS — M4316 Spondylolisthesis, lumbar region: Secondary | ICD-10-CM | POA: Diagnosis not present

## 2014-08-20 DIAGNOSIS — M4716 Other spondylosis with myelopathy, lumbar region: Secondary | ICD-10-CM | POA: Diagnosis not present

## 2015-04-13 ENCOUNTER — Encounter: Payer: Self-pay | Admitting: Nurse Practitioner

## 2015-04-13 ENCOUNTER — Ambulatory Visit (INDEPENDENT_AMBULATORY_CARE_PROVIDER_SITE_OTHER): Payer: Commercial Managed Care - HMO | Admitting: Nurse Practitioner

## 2015-04-13 VITALS — BP 146/78 | HR 85 | Temp 97.1°F | Resp 22 | Ht 65.55 in | Wt >= 6400 oz

## 2015-04-13 DIAGNOSIS — R739 Hyperglycemia, unspecified: Secondary | ICD-10-CM | POA: Diagnosis not present

## 2015-04-13 DIAGNOSIS — G8929 Other chronic pain: Secondary | ICD-10-CM | POA: Diagnosis not present

## 2015-04-13 DIAGNOSIS — M545 Low back pain: Secondary | ICD-10-CM

## 2015-04-13 DIAGNOSIS — I10 Essential (primary) hypertension: Secondary | ICD-10-CM | POA: Diagnosis not present

## 2015-04-13 DIAGNOSIS — J309 Allergic rhinitis, unspecified: Secondary | ICD-10-CM

## 2015-04-13 NOTE — Progress Notes (Signed)
Patient ID: Jesse Meyers, male   DOB: 11-30-1960, 54 y.o.   MRN: [id number]    PCP: Trey Sailors, MD  Allergies  Allergen Reactions  . Other     Perfumes, cats    Chief Complaint  Patient presents with  . Establish Care  . Medical Management of Chronic Issues     HPI: Patient is a 54 y.o. male seen in the office today to re-estalbish care- pt reports he was a patient here many years ago ~5 years ago. Has not seen anyone routinely since.  Has had ruptured disc--back surgery, rods and pins. Dr Trey Sailors in [place] who follows him. Orthopedic prescribes percocet.  No other specialist.  Unsure of when he had colonoscopy- possible when he was 51, 52. No family hx of colon cancer.  Doing well, has had a cold for a few days. Hx of allergies.  No routine exercise  No special diet.  Has already eaten this morning. Advanced Directive information Does patient have an advance directive?: No, Would patient like information on creating an advanced directive?: Yes - Educational materials given Review of Systems:  Review of Systems  Constitutional: Negative for activity change, appetite change, fatigue and unexpected weight change.  HENT: Positive for congestion and rhinorrhea. Negative for hearing loss and postnasal drip.   Eyes: Negative.   Respiratory: Negative for cough and shortness of breath.   Cardiovascular: Positive for leg swelling (in left leg). Negative for chest pain and palpitations.  Gastrointestinal: Negative for abdominal pain, diarrhea and constipation.  Genitourinary: Negative for dysuria and difficulty urinating.  Musculoskeletal: Positive for myalgias and arthralgias.       Chronic pain, chronic. Does not take pain medication daily  Skin: Negative for color change and wound.  Allergic/Immunologic: Positive for environmental allergies.  Neurological: Positive for numbness (and tingling in bilateral leg). Negative for dizziness and weakness.  Psychiatric/Behavioral: Negative  for behavioral problems, confusion and agitation.    Past Medical History  Diagnosis Date  . Morbid obesity   . Chronic back pain 1993 &1995    s/p back surgery  . GERD (gastroesophageal reflux disease)   . Allergic rhinitis   . Edema   . Unsteady gait   . Hiatal hernia   . Hypertension   . Hyperglycemia   . Hemorrhoids   . Allergic rhinitis due to pollen    Past Surgical History  Procedure Laterality Date  . Back surgery  1993 &1995     Dr Channing Mutters   Social History:   reports that he has never smoked. He does not have any smokeless tobacco history on file. He reports that he does not drink alcohol or use illicit drugs.  Family History  Problem Relation Age of Onset  . COPD Mother   . Diabetes Mother   . Cancer Father 31    LT breast   . Hypertension Father   . Heart disease Father     Medications: Patient's Medications  New Prescriptions   No medications on file  Previous Medications   IBUPROFEN (ADVIL,MOTRIN) 200 MG TABLET    Take 200 mg by mouth. Take 1 or 2 times a week   OXYCODONE-ACETAMINOPHEN (PERCOCET) 10-325 MG PER TABLET    Take 1 tablet by mouth every other day.  Modified Medications   No medications on file  Discontinued Medications   No medications on file     Physical Exam:  Filed Vitals:   04/13/15 0901  BP: 146/78  Pulse: 85  Temp:  97.1 F (36.2 C)  TempSrc: Oral  Resp: 22  Height: 5' 5.55" (1.665 m)  Weight: 430 lb 9.6 oz (195.319 kg)  SpO2: 96%  Body mass index is 70.46 kg/(m^2).    Physical Exam  Constitutional: He is oriented to person, place, and time. He appears well-developed and well-nourished. No distress.  Obese male  HENT:  Head: Normocephalic and atraumatic.  Eyes: Conjunctivae and EOM are normal. Pupils are equal, round, and reactive to light.  Neck: Normal range of motion. Neck supple.  Cardiovascular: Normal rate, regular rhythm and normal heart sounds.   Pulmonary/Chest: Effort normal and breath sounds normal.    Abdominal: Soft. Bowel sounds are normal.  Musculoskeletal: He exhibits no edema or tenderness.  Neurological: He is alert and oriented to person, place, and time.  Skin: Skin is warm and dry. He is not diaphoretic.  Psychiatric: He has a normal mood and affect.    Labs reviewed: Basic Metabolic Panel: No results for input(s): NA, K, CL, CO2, GLUCOSE, BUN, CREATININE, CALCIUM, MG, PHOS, TSH in the last 8760 hours. Liver Function Tests: No results for input(s): AST, ALT, ALKPHOS, BILITOT, PROT, ALBUMIN in the last 8760 hours. No results for input(s): LIPASE, AMYLASE in the last 8760 hours. No results for input(s): AMMONIA in the last 8760 hours. CBC: No results for input(s): WBC, NEUTROABS, HGB, HCT, MCV, PLT in the last 8760 hours. Lipid Panel: No results for input(s): CHOL, HDL, LDLCALC, TRIG, CHOLHDL, LDLDIRECT in the last 8760 hours. TSH: No results for input(s): TSH in the last 8760 hours. A1C: No results found for: HGBA1C   Assessment/Plan 1. Essential hypertension -taking some OTC he is unsure of what it is, discussing avoiding OTC decongestants that will make blood pressure worse -discussed diet, low sodium diet, hand out given  -to start exercise, slow regimen and increase as tolerates - Comprehensive metabolic panel; Future - Lipid panel; Future - CBC With Differential; Future  2. Allergic rhinitis, unspecified allergic rhinitis type -increased allergy symptoms at this time -to use Claritin daily, also can use plain nasal saline if needed  3. OBESITY, MORBID -discussed weight loss, diet modifications and exercise -will get lab work prior to next visit  4. Hyperglycemia -pt taking blood sugars with wife diabetic meter, ranging from 100-130 fasting -encouraged low sugar diet - Comprehensive metabolic panel; Future  5. Chronic low back pain -pain remains stable, being managed by ortho  Follow up in 4 weeks for EV with fasting lab work prior to visit.  Janene Harvey. Biagio Borg  [facility] [phone] 8 am - 5 pm) [phone] (after hours)

## 2015-04-13 NOTE — Patient Instructions (Signed)
Bring all over the counter medication that you are taking with you to your next visit.   Make sure you are not taking any over the counter medication with "d" or anything with sudafed, Pseudoephedrine, phenylephrine  Low sodium diet, blood pressure is on the high side may need to start medication at next visit  Exercise 30 mins 5 days a week is goal, start with 10 mins a day-- get into a routine.

## 2015-04-17 ENCOUNTER — Encounter (HOSPITAL_COMMUNITY): Payer: Self-pay | Admitting: Emergency Medicine

## 2015-04-17 ENCOUNTER — Emergency Department (HOSPITAL_COMMUNITY)
Admission: EM | Admit: 2015-04-17 | Discharge: 2015-04-17 | Disposition: A | Discharge status: Home / Self Care | Payer: Medicare HMO | Attending: Emergency Medicine | Admitting: Emergency Medicine

## 2015-04-17 ENCOUNTER — Emergency Department (HOSPITAL_COMMUNITY): Payer: Medicare HMO

## 2015-04-17 DIAGNOSIS — G8929 Other chronic pain: Secondary | ICD-10-CM | POA: Diagnosis not present

## 2015-04-17 DIAGNOSIS — J069 Acute upper respiratory infection, unspecified: Secondary | ICD-10-CM | POA: Diagnosis not present

## 2015-04-17 DIAGNOSIS — R0602 Shortness of breath: Secondary | ICD-10-CM | POA: Diagnosis not present

## 2015-04-17 DIAGNOSIS — J9801 Acute bronchospasm: Secondary | ICD-10-CM | POA: Diagnosis not present

## 2015-04-17 DIAGNOSIS — I10 Essential (primary) hypertension: Secondary | ICD-10-CM | POA: Insufficient documentation

## 2015-04-17 DIAGNOSIS — Z8719 Personal history of other diseases of the digestive system: Secondary | ICD-10-CM | POA: Diagnosis not present

## 2015-04-17 MED ORDER — ALBUTEROL SULFATE HFA 108 (90 BASE) MCG/ACT IN AERS: 1.0000 | INHALATION_SPRAY | Freq: Four times a day (QID) | RESPIRATORY_TRACT | Status: DC | PRN

## 2015-04-17 MED ORDER — DOXYCYCLINE HYCLATE 100 MG PO CAPS: 100.0000 mg | ORAL_CAPSULE | Freq: Two times a day (BID) | ORAL | Status: DC

## 2015-04-17 MED ORDER — BENZONATATE 100 MG PO CAPS: 100.0000 mg | ORAL_CAPSULE | Freq: Three times a day (TID) | ORAL | Status: DC

## 2015-04-17 MED ORDER — ALBUTEROL SULFATE (2.5 MG/3ML) 0.083% IN NEBU
2.5000 mg | INHALATION_SOLUTION | Freq: Once | RESPIRATORY_TRACT | Status: AC
  Administered 2015-04-17: 2.5 mg via RESPIRATORY_TRACT
  Filled 2015-04-17: qty 3

## 2015-04-17 MED ORDER — PREDNISONE 50 MG PO TABS
60.0000 mg | ORAL_TABLET | Freq: Once | ORAL | Status: AC
  Administered 2015-04-17: 20:00:00 60 mg via ORAL
  Filled 2015-04-17 (×2): qty 1

## 2015-04-17 MED ORDER — PREDNISONE 20 MG PO TABS: 20.0000 mg | ORAL_TABLET | Freq: Two times a day (BID) | ORAL | Status: DC

## 2015-04-17 MED ORDER — ALBUTEROL (5 MG/ML) CONTINUOUS INHALATION SOLN
10.0000 mg/h | INHALATION_SOLUTION | Freq: Once | RESPIRATORY_TRACT | Status: AC
  Administered 2015-04-17: 10 mg/h via RESPIRATORY_TRACT
  Filled 2015-04-17: qty 20

## 2015-04-17 MED ORDER — IPRATROPIUM-ALBUTEROL 0.5-2.5 (3) MG/3ML IN SOLN
3.0000 mL | Freq: Once | RESPIRATORY_TRACT | Status: AC
  Administered 2015-04-17: 3 mL via RESPIRATORY_TRACT
  Filled 2015-04-17: qty 3

## 2015-04-17 NOTE — ED Provider Notes (Signed)
CSN: [id number]     Arrival date & time 04/17/15  1909 History   First MD Initiated Contact with Patient 04/17/15 1918     Chief Complaint  Patient presents with  . Shortness of Breath      HPI  Patient presents for evaluation of "I can't catch my breath". History of visit with his primary care physician on Monday. Had nasal congestion and discharge. Was told it was likely allergies. Offered over-the-counter medications. Over the last 2 days has developed worsening cough with wheezing and feeling of breathlessness and presents here. No chest pain. No peripheral edema. No fevers. Cough productive of green sputum. No history of heart disease. No history of congestive heart failure. No history of COPD or bronchospasm. He is a lifetime nonsmoker.  Past Medical History  Diagnosis Date  . Morbid obesity   . Chronic back pain 1993 &1995    s/p back surgery  . GERD (gastroesophageal reflux disease)   . Allergic rhinitis   . Edema   . Unsteady gait   . Hiatal hernia   . Hypertension   . Hyperglycemia   . Hemorrhoids   . Allergic rhinitis due to pollen    Past Surgical History  Procedure Laterality Date  . Back surgery  1993 &1995     Dr Channing Mutters   Family History  Problem Relation Age of Onset  . COPD Mother   . Diabetes Mother   . Cancer Father 62    LT breast   . Hypertension Father   . Heart disease Father    Social History  Substance Use Topics  . Smoking status: Never Smoker   . Smokeless tobacco: Never Used  . Alcohol Use: No    Review of Systems  Constitutional: Negative for fever, chills, diaphoresis, appetite change and fatigue.  HENT: Negative for mouth sores, sore throat and trouble swallowing.   Eyes: Negative for visual disturbance.  Respiratory: Positive for cough, shortness of breath and wheezing. Negative for chest tightness.   Cardiovascular: Negative for chest pain.  Gastrointestinal: Negative for nausea, vomiting, abdominal pain, diarrhea and abdominal  distention.  Endocrine: Negative for polydipsia, polyphagia and polyuria.  Genitourinary: Negative for dysuria, frequency and hematuria.  Musculoskeletal: Negative for gait problem.  Skin: Negative for color change, pallor and rash.  Neurological: Negative for dizziness, syncope, light-headedness and headaches.  Hematological: Does not bruise/bleed easily.  Psychiatric/Behavioral: Negative for behavioral problems and confusion.      Allergies  Other  Home Medications   Prior to Admission medications   Medication Sig Start Date End Date Taking? Authorizing Provider  ibuprofen (ADVIL,MOTRIN) 200 MG tablet Take 400-600 mg by mouth every 6 (six) hours as needed for mild pain.    Yes Historical Provider, MD  albuterol (PROVENTIL HFA;VENTOLIN HFA) 108 (90 BASE) MCG/ACT inhaler Inhale 1-2 puffs into the lungs every 6 (six) hours as needed for wheezing. 04/17/15   Rolland Porter, MD  benzonatate (TESSALON) 100 MG capsule Take 1 capsule (100 mg total) by mouth every 8 (eight) hours. 04/17/15   Rolland Porter, MD  doxycycline (VIBRAMYCIN) 100 MG capsule Take 1 capsule (100 mg total) by mouth 2 (two) times daily. 04/17/15   Rolland Porter, MD  predniSONE (DELTASONE) 20 MG tablet Take 1 tablet (20 mg total) by mouth 2 (two) times daily with a meal. 04/17/15   Rolland Porter, MD   BP 123/56 mmHg  Pulse 93  Temp(Src) 98.2 F (36.8 C) (Oral)  Resp 15  Ht  (1.727  m)  Wt 410 lb (185.975 kg)  BMI 62.35 kg/m2  SpO2 92% Physical Exam  Constitutional: He is oriented to person, place, and time. He appears well-developed and well-nourished. No distress.  HENT:  Head: Normocephalic.  Eyes: Conjunctivae are normal. Pupils are equal, round, and reactive to light. No scleral icterus.  Neck: Normal range of motion. Neck supple. No thyromegaly present.  Cardiovascular: Normal rate and regular rhythm.  Exam reveals no gallop and no friction rub.   No murmur heard. Pulmonary/Chest: Effort normal and breath sounds  normal. No respiratory distress. He has no wheezes. He has no rales.  Globally diminished breath sounds. Diffuse wheezing in all fields. Prolongation.  Abdominal: Soft. Bowel sounds are normal. He exhibits no distension. There is no tenderness. There is no rebound.  Musculoskeletal: Normal range of motion.  Neurological: He is alert and oriented to person, place, and time.  Skin: Skin is warm and dry. No rash noted.  Psychiatric: He has a normal mood and affect. His behavior is normal.    ED Course  Procedures (including critical care time) Labs Review Labs Reviewed - No data to display  Imaging Review No results found. I have personally reviewed and evaluated these images and lab results as part of my medical decision-making.   EKG Interpretation None      MDM   Final diagnoses:  URI (upper respiratory infection)  Bronchospasm    Given 1 hour continuous nebulized albuterol. Prednisone. Was doing considerably better. Minimal residual wheezing. Given additional neb and clears. Chest x-ray shows no acute abnormalities. Clinically and radiographically is not in congestive heart failure. Has no pain no fever and is not hypoxemic. Appropriate for outpatient treatment. Plan prednisone, Tessalon, albuterol MDI, doxycycline, primary care follow-up.  CRITICAL CARE Performed by: Rolland Porter JOSEPH   Total critical care time: 60 minutes continuous nebulized albuterol multiple re-evaluations.  Critical care time was exclusive of separately billable procedures and treating other patients.  Critical care was necessary to treat or prevent imminent or life-threatening deterioration.  Critical care was time spent personally by me on the following activities: development of treatment plan with patient and/or surrogate as well as nursing, discussions with consultants, evaluation of patient's response to treatment, examination of patient, obtaining history from patient or surrogate, ordering  and performing treatments and interventions, ordering and review of laboratory studies, ordering and review of radiographic studies, pulse oximetry and re-evaluation of patient's condition. care    Rolland Porter, MD 04/17/15 2131

## 2015-04-17 NOTE — ED Notes (Signed)
Pt reports he is feeling better, breathing easier. Family at bedside.

## 2015-04-17 NOTE — ED Notes (Signed)
Pt to be discharged post neb tx.

## 2015-04-17 NOTE — Discharge Instructions (Signed)
Bronchospasm °A bronchospasm is a spasm or tightening of the airways going into the lungs. During a bronchospasm breathing becomes more difficult because the airways get smaller. When this happens there can be coughing, a whistling sound when breathing (wheezing), and difficulty breathing. Bronchospasm is often associated with asthma, but not all patients who experience a bronchospasm have asthma. °CAUSES  °A bronchospasm is caused by inflammation or irritation of the airways. The inflammation or irritation may be triggered by:  °· Allergies (such as to animals, pollen, food, or mold). Allergens that cause bronchospasm may cause wheezing immediately after exposure or many hours later.   °· Infection. Viral infections are believed to be the most common cause of bronchospasm.   °· Exercise.   °· Irritants (such as pollution, cigarette smoke, strong odors, aerosol sprays, and paint fumes).   °· Weather changes. Winds increase molds and pollens in the air. Rain refreshes the air by washing irritants out. Cold air may cause inflammation.   °· Stress and emotional upset.   °SIGNS AND SYMPTOMS  °· Wheezing.   °· Excessive nighttime coughing.   °· Frequent or severe coughing with a simple cold.   °· Chest tightness.   °· Shortness of breath.   °DIAGNOSIS  °Bronchospasm is usually diagnosed through a history and physical exam. Tests, such as chest X-rays, are sometimes done to look for other conditions. °TREATMENT  °· Inhaled medicines can be given to open up your airways and help you breathe. The medicines can be given using either an inhaler or a nebulizer machine. °· Corticosteroid medicines may be given for severe bronchospasm, usually when it is associated with asthma. °HOME CARE INSTRUCTIONS  °· Always have a plan prepared for seeking medical care. Know when to call your health care provider and local emergency services (911 in the [place] Know where you can access local emergency care. °· Only take medicines as  directed by your health care provider. °· If you were prescribed an inhaler or nebulizer machine, ask your health care provider to explain how to use it correctly. Always use a spacer with your inhaler if you were given one. °· It is necessary to remain calm during an attack. Try to relax and breathe more slowly.  °· Control your home environment in the following ways:   °¨ Change your heating and air conditioning filter at least once a month.   °¨ Limit your use of fireplaces and wood stoves. °¨ Do not smoke and do not allow smoking in your home.   °¨ Avoid exposure to perfumes and fragrances.   °¨ Get rid of pests (such as roaches and mice) and their droppings.   °¨ Throw away plants if you see mold on them.   °¨ Keep your house clean and dust free.   °¨ Replace carpet with wood, tile, or vinyl flooring. Carpet can trap dander and dust.   °¨ Use allergy-proof pillows, mattress covers, and box spring covers.   °¨ Wash bed sheets and blankets every week in hot water and dry them in a dryer.   °¨ Use blankets that are made of polyester or cotton.   °¨ Wash hands frequently. °SEEK MEDICAL CARE IF:  °· You have muscle aches.   °· You have chest pain.   °· The sputum changes from clear or white to yellow, green, gray, or bloody.   °· The sputum you cough up gets thicker.   °· There are problems that may be related to the medicine you are given, such as a rash, itching, swelling, or trouble breathing.   °SEEK IMMEDIATE MEDICAL CARE IF:  °· You have worsening wheezing and coughing even   after taking your prescribed medicines.   °· You have increased difficulty breathing.   °· You develop severe chest pain. °MAKE SURE YOU:  °· Understand these instructions. °· Will watch your condition. °· Will get help right away if you are not doing well or get worse. °Document Released: 07/27/2003 Document Revised: 07/29/2013 Document Reviewed: 01/13/2013 °ExitCare® Patient Information ©2015 [employer]. This information is not  intended to replace advice given to you by your health care provider. Make sure you discuss any questions you have with your health care provider. °Upper Respiratory Infection, Adult °An upper respiratory infection (URI) is also known as the common cold. It is often caused by a type of germ (virus). Colds are easily spread (contagious). You can pass it to others by kissing, coughing, sneezing, or drinking out of the same glass. Usually, you get better in 1 or 2 weeks.  °HOME CARE  °· Only take medicine as told by your doctor. °· Use a warm mist humidifier or breathe in steam from a hot shower. °· Drink enough water and fluids to keep your pee (urine) clear or pale yellow. °· Get plenty of rest. °· Return to work when your temperature is back to normal or as told by your doctor. You may use a face mask and wash your hands to stop your cold from spreading. °GET HELP RIGHT AWAY IF:  °· After the first few days, you feel you are getting worse. °· You have questions about your medicine. °· You have chills, shortness of breath, or brown or red spit (mucus). °· You have yellow or brown snot (nasal discharge) or pain in the face, especially when you bend forward. °· You have a fever, puffy (swollen) neck, pain when you swallow, or white spots in the back of your throat. °· You have a bad headache, ear pain, sinus pain, or chest pain. °· You have a high-pitched whistling sound when you breathe in and out (wheezing). °· You have a lasting cough or cough up blood. °· You have sore muscles or a stiff neck. °MAKE SURE YOU:  °· Understand these instructions. °· Will watch your condition. °· Will get help right away if you are not doing well or get worse. °Document Released: 01/10/2008 Document Revised: 10/16/2011 Document Reviewed: 10/29/2013 °ExitCare® Patient Information ©2015 [employer]. This information is not intended to replace advice given to you by your health care provider. Make sure you discuss any questions you have  with your health care provider. ° °

## 2015-04-17 NOTE — ED Notes (Signed)
RT contacted for neb tx.

## 2015-04-17 NOTE — ED Notes (Signed)
Pt saw the doctor on Monday for head cold. States he has gotten worse this weak and today he has started wheezing and harder to "catch breath"

## 2015-04-27 ENCOUNTER — Telehealth: Payer: Self-pay | Admitting: *Deleted

## 2015-04-27 ENCOUNTER — Other Ambulatory Visit: Payer: Self-pay | Admitting: Nurse Practitioner

## 2015-04-27 DIAGNOSIS — M545 Low back pain, unspecified: Secondary | ICD-10-CM | POA: Insufficient documentation

## 2015-04-27 DIAGNOSIS — G8929 Other chronic pain: Secondary | ICD-10-CM | POA: Insufficient documentation

## 2015-04-27 NOTE — Telephone Encounter (Signed)
Patient called and stated that Shanda Bumps was going to do a referral for him to see Dr. Channing Mutters for his back. Patient stated that he has an appointment tomorrow at 9:15 and needs this referral. Please Advise.

## 2015-04-27 NOTE — Telephone Encounter (Signed)
Referral made 

## 2015-04-28 DIAGNOSIS — M4316 Spondylolisthesis, lumbar region: Secondary | ICD-10-CM | POA: Diagnosis not present

## 2015-05-10 ENCOUNTER — Other Ambulatory Visit: Payer: Commercial Managed Care - HMO

## 2015-05-13 ENCOUNTER — Encounter: Payer: Commercial Managed Care - HMO | Admitting: Nurse Practitioner

## 2015-06-04 ENCOUNTER — Other Ambulatory Visit: Payer: Commercial Managed Care - HMO

## 2015-06-08 ENCOUNTER — Encounter: Payer: Commercial Managed Care - HMO | Admitting: Nurse Practitioner

## 2015-07-19 ENCOUNTER — Telehealth: Payer: Self-pay

## 2015-07-19 NOTE — Telephone Encounter (Signed)
Called patient, patient states he is currently out of town with his father (father is sick). Patient will call after the 1st of the year ro reschedule EV with labs.

## 2015-07-19 NOTE — Telephone Encounter (Signed)
-----   Message from Sharon SellerJessica K Eubanks, NP sent at 07/19/2015 10:15 AM EST ----- Pt was scheduled for EV with labs, has cancelled this and needs to reschedule   ----- Message -----    From: SYSTEM    Sent: 07/18/2015  12:04 AM      To: Sharon SellerJessica K Eubanks, NP

## 2015-08-27 ENCOUNTER — Encounter: Payer: Self-pay | Admitting: Nurse Practitioner

## 2015-09-07 ENCOUNTER — Ambulatory Visit: Payer: Self-pay | Admitting: Nurse Practitioner

## 2015-09-14 ENCOUNTER — Ambulatory Visit: Payer: Commercial Managed Care - HMO | Admitting: Nurse Practitioner

## 2015-10-07 ENCOUNTER — Ambulatory Visit (INDEPENDENT_AMBULATORY_CARE_PROVIDER_SITE_OTHER): Payer: Commercial Managed Care - HMO | Admitting: Nurse Practitioner

## 2015-10-07 ENCOUNTER — Encounter: Payer: Self-pay | Admitting: Nurse Practitioner

## 2015-10-07 DIAGNOSIS — G8929 Other chronic pain: Secondary | ICD-10-CM

## 2015-10-07 DIAGNOSIS — R739 Hyperglycemia, unspecified: Secondary | ICD-10-CM

## 2015-10-07 DIAGNOSIS — M545 Low back pain, unspecified: Secondary | ICD-10-CM

## 2015-10-07 DIAGNOSIS — I10 Essential (primary) hypertension: Secondary | ICD-10-CM

## 2015-10-07 NOTE — Patient Instructions (Addendum)
Make appt for blood work as soon as possible.   Follow up for annual exam in 6 weeks with Dr Montez Morita.  Have records sent of when you had colonoscopy  Calorie Counting for Weight Loss Calories are energy you get from the things you eat and drink. Your body uses this energy to keep you going throughout the day. The number of calories you eat affects your weight. When you eat more calories than your body needs, your body stores the extra calories as fat. When you eat fewer calories than your body needs, your body burns fat to get the energy it needs. Calorie counting means keeping track of how many calories you eat and drink each day. If you make sure to eat fewer calories than your body needs, you should lose weight. In order for calorie counting to work, you will need to eat the number of calories that are right for you in a day to lose a healthy amount of weight per week. A healthy amount of weight to lose per week is usually 1-2 lb (0.5-0.9 kg). A dietitian can determine how many calories you need in a day and give you suggestions on how to reach your calorie goal.   WHAT DO I NEED TO KNOW ABOUT CALORIE COUNTING? In order to meet your daily calorie goal, you will need to:  Find out how many calories are in each food you would like to eat. Try to do this before you eat.  Decide how much of the food you can eat.  Write down what you ate and how many calories it had. Doing this is called keeping a food log. WHERE DO I FIND CALORIE INFORMATION? The number of calories in a food can be found on a Nutrition Facts label. Note that all the information on a label is based on a specific serving of the food. If a food does not have a Nutrition Facts label, try to look up the calories online or ask your dietitian for help. HOW DO I DECIDE HOW MUCH TO EAT? To decide how much of the food you can eat, you will need to consider both the number of calories in one serving and the size of one serving. This  information can be found on the Nutrition Facts label. If a food does not have a Nutrition Facts label, look up the information online or ask your dietitian for help. Remember that calories are listed per serving. If you choose to have more than one serving of a food, you will have to multiply the calories per serving by the amount of servings you plan to eat. For example, the label on a package of bread might say that a serving size is 1 slice and that there are 90 calories in a serving. If you eat 1 slice, you will have eaten 90 calories. If you eat 2 slices, you will have eaten 180 calories. HOW DO I KEEP A FOOD LOG? After each meal, record the following information in your food log:  What you ate.  How much of it you ate.  How many calories it had.  Then, add up your calories. Keep your food log near you, such as in a small notebook in your pocket. Another option is to use a mobile app or website. Some programs will calculate calories for you and show you how many calories you have left each time you add an item to the log. WHAT ARE SOME CALORIE COUNTING TIPS?  Use your calories  on foods and drinks that will fill you up and not leave you hungry. Some examples of this include foods like nuts and nut butters, vegetables, lean proteins, and high-fiber foods (more than 5 g fiber per serving).  Eat nutritious foods and avoid empty calories. Empty calories are calories you get from foods or beverages that do not have many nutrients, such as candy and soda. It is better to have a nutritious high-calorie food (such as an avocado) than a food with few nutrients (such as a bag of chips).  Know how many calories are in the foods you eat most often. This way, you do not have to look up how many calories they have each time you eat them.  Look out for foods that may seem like low-calorie foods but are really high-calorie foods, such as baked goods, soda, and fat-free candy.  Pay attention to calories  in drinks. Drinks such as sodas, specialty coffee drinks, alcohol, and juices have a lot of calories yet do not fill you up. Choose low-calorie drinks like water and diet drinks.  Focus your calorie counting efforts on higher calorie items. Logging the calories in a garden salad that contains only vegetables is less important than calculating the calories in a milk shake.  Find a way of tracking calories that works for you. Get creative. Most people who are successful find ways to keep track of how much they eat in a day, even if they do not count every calorie. WHAT ARE SOME PORTION CONTROL TIPS?  Know how many calories are in a serving. This will help you know how many servings of a certain food you can have.  Use a measuring cup to measure serving sizes. This is helpful when you start out. With time, you will be able to estimate serving sizes for some foods.  Take some time to put servings of different foods on your favorite plates, bowls, and cups so you know what a serving looks like.  Try not to eat straight from a bag or box. Doing this can lead to overeating. Put the amount you would like to eat in a cup or on a plate to make sure you are eating the right portion.  Use smaller plates, glasses, and bowls to prevent overeating. This is a quick and easy way to practice portion control. If your plate is smaller, less food can fit on it.  Try not to multitask while eating, such as watching TV or using your computer. If it is time to eat, sit down at a table and enjoy your food. Doing this will help you to start recognizing when you are full. It will also make you more aware of what and how much you are eating. HOW CAN I CALORIE COUNT WHEN EATING OUT?  Ask for smaller portion sizes or child-sized portions.  Consider sharing an entree and sides instead of getting your own entree.  If you get your own entree, eat only half. Ask for a box at the beginning of your meal and put the rest of your  entree in it so you are not tempted to eat it.  Look for the calories on the menu. If calories are listed, choose the lower calorie options.  Choose dishes that include vegetables, fruits, whole grains, low-fat dairy products, and lean protein. Focusing on smart food choices from each of the 5 food groups can help you stay on track at restaurants.  Choose items that are boiled, broiled, grilled, or steamed.  Choose water, milk, unsweetened iced tea, or other drinks without added sugars. If you want an alcoholic beverage, choose a lower calorie option. For example, a regular margarita can have up to 700 calories and a glass of wine has around 150.  Stay away from items that are buttered, battered, fried, or served with cream sauce. Items labeled "crispy" are usually fried, unless stated otherwise.  Ask for dressings, sauces, and syrups on the side. These are usually very high in calories, so do not eat much of them.  Watch out for salads. Many people think salads are a healthy option, but this is often not the case. Many salads come with bacon, fried chicken, lots of cheese, fried chips, and dressing. All of these items have a lot of calories. If you want a salad, choose a garden salad and ask for grilled meats or steak. Ask for the dressing on the side, or ask for olive oil and vinegar or lemon to use as dressing.  Estimate how many servings of a food you are given. For example, a serving of cooked rice is  cup or about the size of half a tennis ball or one cupcake wrapper. Knowing serving sizes will help you be aware of how much food you are eating at restaurants. The list below tells you how big or small some common portion sizes are based on everyday objects.  1 oz--4 stacked dice.  3 oz--1 deck of cards.  1 tsp--1 dice.  1 Tbsp-- a Ping-Pong ball.  2 Tbsp--1 Ping-Pong ball.   cup--1 tennis ball or 1 cupcake wrapper.  1 cup--1 baseball.   This information is not intended to  replace advice given to you by your health care provider. Make sure you discuss any questions you have with your health care provider.   Document Released: 07/24/2005 Document Revised: 08/14/2014 Document Reviewed: 05/29/2013 Elsevier Interactive Patient Education [employer]

## 2015-10-07 NOTE — Progress Notes (Signed)
Patient ID: Jesse Meyers, male   DOB: 03/24/1961, 55 y.o.   MRN: [id number]    PCP: Lauree Chandler, NP  Advanced Directive information Does patient have an advance directive?: No  Allergies  Allergen Reactions  . Other     Perfumes, cats cause a rash and shortness of breath    Chief Complaint  Patient presents with  . Medical Management of Chronic Issues    blood pressure, hyperglycemia  . OTHER    wants to loss weight     HPI: Patient is a 55 y.o. male seen in the office today for routine follow up on chronic conditions. Pt missed physical. Reports he had a lot going on. Has not fasted this morning. Did not get blood work prior to visit.  Dr Carloyn Manner- orthopedic prescribes prednisone which he takes for his back. Sometimes he takes less than 20 mg twice daily.  Only uses albuterol occasional has been a few months.  Does not do much exercise- Walks when he is able to- a little bit every day.  Has cut back on "drinks"- drinking more water.  Cutting back on portions  Had met with a nutritionist in the past  Reports he had colonoscopy back in 2010 at [facility]- just because they wanted to do one.- do not see record of this.   Review of Systems:  Review of Systems  Constitutional: Negative for activity change, appetite change, fatigue and unexpected weight change.  HENT: Positive for rhinorrhea. Negative for hearing loss and postnasal drip.   Eyes: Negative.   Respiratory: Negative for cough and shortness of breath.   Cardiovascular: Positive for leg swelling (in left leg- worse in the evening). Negative for chest pain and palpitations.  Gastrointestinal: Negative for abdominal pain, diarrhea and constipation.  Genitourinary: Negative for dysuria and difficulty urinating.  Musculoskeletal: Positive for myalgias and arthralgias.       Chronic pain, takes prednisone per ortho  Skin: Negative for color change and wound.  Allergic/Immunologic: Positive for environmental  allergies.  Neurological: Positive for numbness (and tingling in bilateral leg). Negative for dizziness and weakness.  Psychiatric/Behavioral: Negative for confusion and agitation.    Past Medical History  Diagnosis Date  . Morbid obesity ([place])   . Chronic back pain 1993 &1995    s/p back surgery  . GERD (gastroesophageal reflux disease)   . Allergic rhinitis   . Edema   . Unsteady gait   . Hiatal hernia   . Hypertension   . Hyperglycemia   . Hemorrhoids   . Allergic rhinitis due to pollen    Past Surgical History  Procedure Laterality Date  . Back surgery  1993 &1995     Dr Carloyn Manner   Social History:   reports that he has never smoked. He has never used smokeless tobacco. He reports that he does not drink alcohol or use illicit drugs.  Family History  Problem Relation Age of Onset  . COPD Mother   . Diabetes Mother   . Cancer Father 44    LT breast   . Hypertension Father   . Heart disease Father     Medications: Patient's Medications  New Prescriptions   No medications on file  Previous Medications   ALBUTEROL (PROVENTIL HFA;VENTOLIN HFA) 108 (90 BASE) MCG/ACT INHALER    Inhale 1-2 puffs into the lungs every 6 (six) hours as needed for wheezing.   IBUPROFEN (ADVIL,MOTRIN) 200 MG TABLET    Take 400-600 mg by mouth every 6 (  six) hours as needed for mild pain.    PREDNISONE (DELTASONE) 20 MG TABLET    Take 1 tablet (20 mg total) by mouth 2 (two) times daily with a meal.  Modified Medications   No medications on file  Discontinued Medications   BENZONATATE (TESSALON) 100 MG CAPSULE    Take 1 capsule (100 mg total) by mouth every 8 (eight) hours.   DOXYCYCLINE (VIBRAMYCIN) 100 MG CAPSULE    Take 1 capsule (100 mg total) by mouth 2 (two) times daily.     Physical Exam:  Filed Vitals:   10/07/15 0824  BP: 138/86  Pulse: 67  Temp: 97.7 F (36.5 C)  TempSrc: Oral  Height: 5' 8"  (1.727 m)  Weight: 432 lb (195.954 kg)  SpO2: 96%   Body mass index is 65.7  kg/(m^2).  Physical Exam  Constitutional: He is oriented to person, place, and time. He appears well-developed and well-nourished. No distress.  Obese male  HENT:  Head: Normocephalic and atraumatic.  Eyes: Conjunctivae and EOM are normal. Pupils are equal, round, and reactive to light.  Neck: Normal range of motion. Neck supple.  Cardiovascular: Normal rate, regular rhythm and normal heart sounds.   Pulmonary/Chest: Effort normal and breath sounds normal.  Abdominal: Soft. Bowel sounds are normal.  Musculoskeletal: He exhibits no edema or tenderness.  Neurological: He is alert and oriented to person, place, and time.  Skin: Skin is warm and dry. He is not diaphoretic.  Psychiatric: He has a normal mood and affect.    Labs reviewed: Basic Metabolic Panel: No results for input(s): NA, K, CL, CO2, GLUCOSE, BUN, CREATININE, CALCIUM, MG, PHOS, TSH in the last 8760 hours. Liver Function Tests: No results for input(s): AST, ALT, ALKPHOS, BILITOT, PROT, ALBUMIN in the last 8760 hours. No results for input(s): LIPASE, AMYLASE in the last 8760 hours. No results for input(s): AMMONIA in the last 8760 hours. CBC: No results for input(s): WBC, NEUTROABS, HGB, HCT, MCV, PLT in the last 8760 hours. Lipid Panel: No results for input(s): CHOL, HDL, LDLCALC, TRIG, CHOLHDL, LDLDIRECT in the last 8760 hours. TSH: No results for input(s): TSH in the last 8760 hours. A1C: No results found for: HGBA1C   Assessment/Plan 1. Morbid obesity due to excess calories ([place]) -would like to lose weight, discussed calorie counting and intake.  To increase activity as tolerates -has not had blood work checked, not fasting today -pt agrees to follow up with fasting blood work asap  2. Chronic low back pain -ongoing, attempted several medication. Prednisone working best for him at this time. Does not take full 20 mg BID. Prescribed by ortho  3. Essential hypertension -stable, not currently on  medication -low sodium diet  4. Hyperglycemia -will follow up labs, discussed cutting back on carbs/sweets/sugars  Pt missed EV and did not get blood work, will have pt follow up for lab work ASAP and have EV with Dr Eulas Post in 6 weeks. To get records for colonoscopy.   Carlos American. Harle Battiest  [facility] [phone] 8 am - 5 pm) [phone] (after hours)

## 2015-10-11 ENCOUNTER — Other Ambulatory Visit: Payer: Self-pay | Admitting: *Deleted

## 2015-10-11 ENCOUNTER — Telehealth: Payer: Self-pay | Admitting: *Deleted

## 2015-10-11 DIAGNOSIS — Z0000 Encounter for general adult medical examination without abnormal findings: Secondary | ICD-10-CM

## 2015-10-11 NOTE — Telephone Encounter (Signed)
Called patient to inform him that he was scheduled for a annual exam on 12/03/15 and labs on 11/29/15. He stated that he understood and would keep his appointment.

## 2015-10-11 NOTE — Telephone Encounter (Signed)
-----   Message from Sharon SellerJessica K Eubanks, NP sent at 10/11/2015  9:27 AM EST ----- Please make sure pt has appt scheduled for lab work ASAP, he has had fasting labs done in a while  ----- Message -----    From: SYSTEM    Sent: 10/09/2015  12:04 AM      To: Sharon SellerJessica K Eubanks, NP

## 2015-11-29 ENCOUNTER — Other Ambulatory Visit: Payer: Self-pay

## 2015-12-03 ENCOUNTER — Encounter: Payer: Self-pay | Admitting: Internal Medicine

## 2016-01-25 DIAGNOSIS — M4316 Spondylolisthesis, lumbar region: Secondary | ICD-10-CM | POA: Diagnosis not present

## 2016-06-14 ENCOUNTER — Telehealth: Payer: Self-pay | Admitting: Nurse Practitioner

## 2016-06-14 NOTE — Telephone Encounter (Signed)
left msg asking pt to confirm this AWV appt w/ nurse. VDM (DD) °

## 2016-07-06 ENCOUNTER — Other Ambulatory Visit: Payer: Commercial Managed Care - HMO

## 2016-07-10 ENCOUNTER — Ambulatory Visit: Payer: Commercial Managed Care - HMO

## 2016-07-10 ENCOUNTER — Encounter: Payer: Commercial Managed Care - HMO | Admitting: Nurse Practitioner

## 2016-07-13 DIAGNOSIS — M4316 Spondylolisthesis, lumbar region: Secondary | ICD-10-CM | POA: Diagnosis not present

## 2016-07-19 ENCOUNTER — Other Ambulatory Visit: Payer: Self-pay

## 2016-07-19 DIAGNOSIS — I10 Essential (primary) hypertension: Secondary | ICD-10-CM

## 2016-07-19 DIAGNOSIS — R739 Hyperglycemia, unspecified: Secondary | ICD-10-CM

## 2016-07-20 ENCOUNTER — Other Ambulatory Visit: Payer: Commercial Managed Care - HMO

## 2016-07-24 ENCOUNTER — Encounter: Payer: Commercial Managed Care - HMO | Admitting: Nurse Practitioner

## 2016-07-24 ENCOUNTER — Ambulatory Visit: Payer: Commercial Managed Care - HMO

## 2016-08-11 ENCOUNTER — Telehealth: Payer: Self-pay | Admitting: Nurse Practitioner

## 2016-08-11 NOTE — Telephone Encounter (Signed)
I spoke w/ pt to schedule AWV/CPE. He states that he is in [place] right now and it may be awhile before he returns.  He will call us back to schedule. VDM (DD)

## 2016-09-11 IMAGING — DX DG CHEST 2V
2 series · 2 of 2 positions shown · non-contrast
Comparison: 04/14/2013

CLINICAL DATA: Shortness of breath. Saw doctor on [REDACTED] for a head
cold. Symptoms of gotten worse. Started wheezing today.

EXAM:
CHEST  2 VIEW

[chest pa]
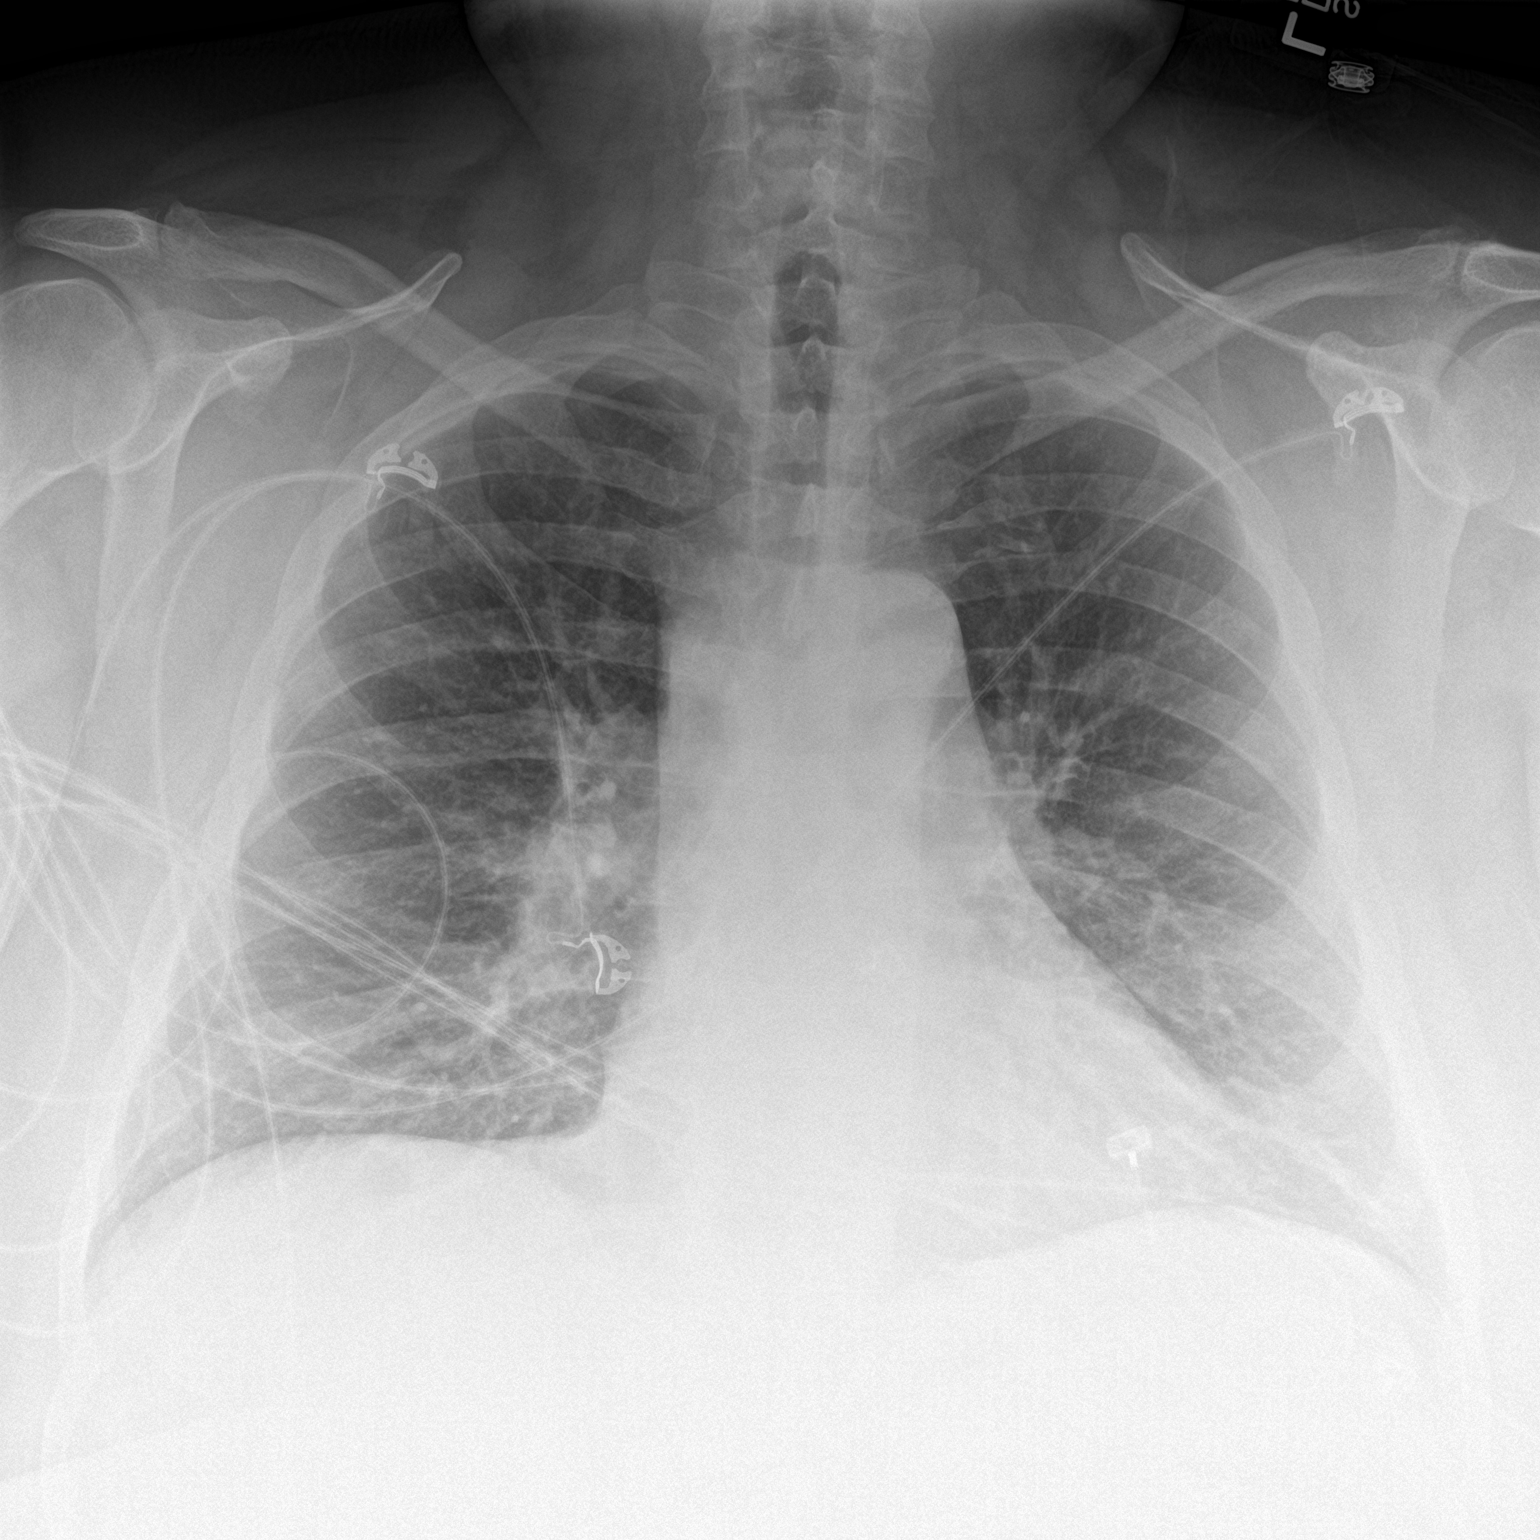

[chest lat]
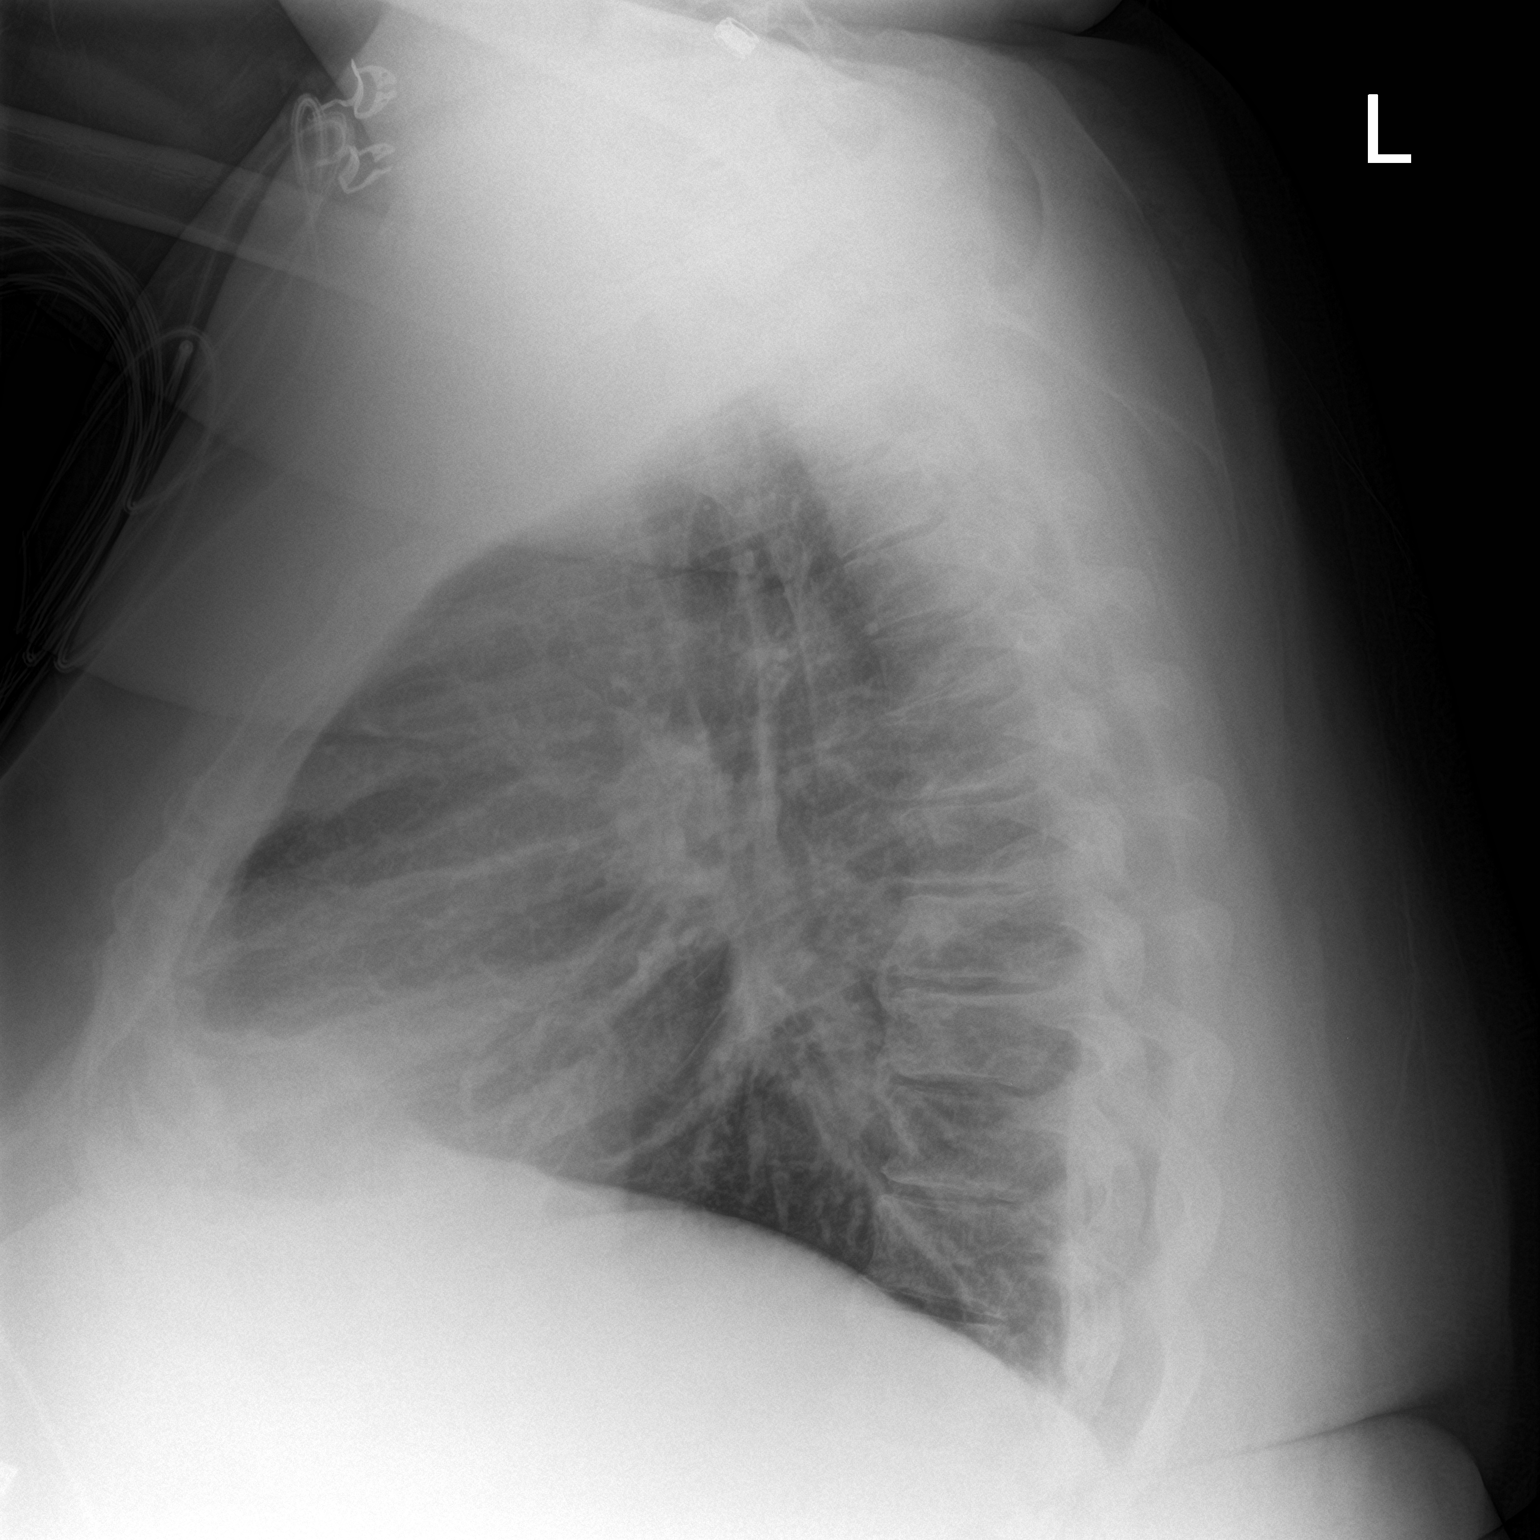

[2 of 2 positions shown; findings below may reference images not displayed]

FINDINGS: Shallow inspiration. Normal heart size and pulmonary vascularity. No
focal airspace disease or consolidation in the lungs. No blunting of
costophrenic angles. No pneumothorax. Mediastinal contours appear
intact. Degenerative changes in the spine.
IMPRESSION: No active cardiopulmonary disease.

## 2016-10-11 DIAGNOSIS — M4316 Spondylolisthesis, lumbar region: Secondary | ICD-10-CM | POA: Diagnosis not present

## 2016-10-11 DIAGNOSIS — M4716 Other spondylosis with myelopathy, lumbar region: Secondary | ICD-10-CM | POA: Diagnosis not present

## 2016-12-08 ENCOUNTER — Encounter (HOSPITAL_COMMUNITY): Payer: Self-pay | Admitting: *Deleted

## 2016-12-08 ENCOUNTER — Emergency Department (HOSPITAL_COMMUNITY): Payer: Medicare HMO

## 2016-12-08 ENCOUNTER — Emergency Department (HOSPITAL_COMMUNITY)
Admission: EM | Admit: 2016-12-08 | Discharge: 2016-12-08 | Disposition: A | Discharge status: Home / Self Care | Payer: Medicare HMO | Attending: Emergency Medicine | Admitting: Emergency Medicine

## 2016-12-08 DIAGNOSIS — Z79899 Other long term (current) drug therapy: Secondary | ICD-10-CM | POA: Diagnosis not present

## 2016-12-08 DIAGNOSIS — R05 Cough: Secondary | ICD-10-CM | POA: Diagnosis not present

## 2016-12-08 DIAGNOSIS — I10 Essential (primary) hypertension: Secondary | ICD-10-CM | POA: Diagnosis not present

## 2016-12-08 DIAGNOSIS — J40 Bronchitis, not specified as acute or chronic: Secondary | ICD-10-CM

## 2016-12-08 DIAGNOSIS — R0602 Shortness of breath: Secondary | ICD-10-CM | POA: Diagnosis not present

## 2016-12-08 MED ORDER — ALBUTEROL SULFATE HFA 108 (90 BASE) MCG/ACT IN AERS: 1.0000 | INHALATION_SPRAY | Freq: Four times a day (QID) | RESPIRATORY_TRACT | 0 refills | Status: AC | PRN

## 2016-12-08 MED ORDER — IPRATROPIUM-ALBUTEROL 0.5-2.5 (3) MG/3ML IN SOLN
3.0000 mL | Freq: Once | RESPIRATORY_TRACT | Status: AC
  Administered 2016-12-08: 3 mL via RESPIRATORY_TRACT
  Filled 2016-12-08: qty 3

## 2016-12-08 MED ORDER — PREDNISONE 10 MG PO TABS: 20.0000 mg | ORAL_TABLET | Freq: Every day | ORAL | 0 refills | Status: AC

## 2016-12-08 MED ORDER — ALBUTEROL SULFATE (2.5 MG/3ML) 0.083% IN NEBU
2.5000 mg | INHALATION_SOLUTION | Freq: Once | RESPIRATORY_TRACT | Status: AC
  Administered 2016-12-08: 2.5 mg via RESPIRATORY_TRACT
  Filled 2016-12-08: qty 3

## 2016-12-08 MED ORDER — AZITHROMYCIN 250 MG PO TABS: ORAL_TABLET | ORAL | 0 refills | Status: AC

## 2016-12-08 MED ORDER — PREDNISONE 10 MG PO TABS
60.0000 mg | ORAL_TABLET | Freq: Once | ORAL | Status: AC
  Administered 2016-12-08: 60 mg via ORAL
  Filled 2016-12-08: qty 1

## 2016-12-08 NOTE — ED Provider Notes (Signed)
[facility] DEPT Provider Note   CSN: [id number] Arrival date & time: 12/08/16  1544     History   Chief Complaint Chief Complaint  Patient presents with  . Cough    HPI Jesse Meyers is a 56 y.o. male.  Patient complains of cough and shortness of breath and wheezing   The history is provided by the patient. No language interpreter was used.  Cough  This is a new problem. The current episode started more than 2 days ago. The problem occurs constantly. The cough is productive of sputum. There has been no fever. Associated symptoms include wheezing. Pertinent negatives include no chest pain and no headaches.    Past Medical History:  Diagnosis Date  . Allergic rhinitis   . Allergic rhinitis due to pollen   . Chronic back pain 1993 &1995   s/p back surgery  . Edema   . GERD (gastroesophageal reflux disease)   . Hemorrhoids   . Hiatal hernia   . Hyperglycemia   . Hypertension   . Morbid obesity (HCC)   . Unsteady gait     Patient Active Problem List   Diagnosis Date Noted  . Chronic low back pain 04/27/2015  . OBESITY, MORBID 03/19/2009  . GERD 03/19/2009    Past Surgical History:  Procedure Laterality Date  . BACK SURGERY  1993 &1995    Dr Channing Mutters       Home Medications    Prior to Admission medications   Medication Sig Start Date End Date Taking? Authorizing Provider  cetirizine (ZYRTEC) 10 MG tablet Take 10 mg by mouth daily.   Yes Historical Provider, MD  oxyCODONE-acetaminophen (PERCOCET) 10-325 MG tablet Take 1 tablet by mouth every 4 (four) hours as needed. 11/29/16  Yes Historical Provider, MD  albuterol (PROVENTIL HFA;VENTOLIN HFA) 108 (90 Base) MCG/ACT inhaler Inhale 1-2 puffs into the lungs every 6 (six) hours as needed for wheezing or shortness of breath. 12/08/16   Bethann Berkshire, MD  azithromycin (ZITHROMAX Z-PAK) 250 MG tablet 2 po day one, then 1 daily x 4 days 12/08/16   Bethann Berkshire, MD  predniSONE (DELTASONE) 10 MG tablet Take 2 tablets (20  mg total) by mouth daily. 12/08/16   Bethann Berkshire, MD    Family History Family History  Problem Relation Age of Onset  . COPD Mother   . Diabetes Mother   . Cancer Father 74    LT breast   . Hypertension Father   . Heart disease Father     Social History Social History  Substance Use Topics  . Smoking status: Never Smoker  . Smokeless tobacco: Never Used  . Alcohol use No     Allergies   Other   Review of Systems Review of Systems  Constitutional: Negative for appetite change and fatigue.  HENT: Negative for congestion, ear discharge and sinus pressure.   Eyes: Negative for discharge.  Respiratory: Positive for cough and wheezing.   Cardiovascular: Negative for chest pain.  Gastrointestinal: Negative for abdominal pain and diarrhea.  Genitourinary: Negative for frequency and hematuria.  Musculoskeletal: Negative for back pain.  Skin: Negative for rash.  Neurological: Negative for seizures and headaches.  Psychiatric/Behavioral: Negative for hallucinations.     Physical Exam Updated Vital Signs BP 135/64   Pulse 84   Temp 97.9 F (36.6 C) (Oral)   Resp 18   Ht 5\' 8"  (1.727 m)   Wt (!) 380 lb (172.4 kg)   SpO2 97%   BMI 57.78 kg/m  Physical Exam  Constitutional: He is oriented to person, place, and time. He appears well-developed.  HENT:  Head: Normocephalic.  Eyes: Conjunctivae and EOM are normal. No scleral icterus.  Neck: Neck supple. No thyromegaly present.  Cardiovascular: Normal rate and regular rhythm.  Exam reveals no gallop and no friction rub.   No murmur heard. Pulmonary/Chest: No stridor. He has wheezes. He has no rales. He exhibits no tenderness.  Abdominal: He exhibits no distension. There is no tenderness. There is no rebound.  Musculoskeletal: Normal range of motion. He exhibits no edema.  Lymphadenopathy:    He has no cervical adenopathy.  Neurological: He is oriented to person, place, and time. He exhibits normal muscle tone.  Coordination normal.  Skin: No rash noted. No erythema.  Psychiatric: He has a normal mood and affect. His behavior is normal.     ED Treatments / Results  Labs (all labs ordered are listed, but only abnormal results are displayed) Labs Reviewed - No data to display  EKG  EKG Interpretation None       Radiology Dg Chest 2 View  Result Date: 12/08/2016 CLINICAL DATA:  Productive cough with shortness of breath for 2 days. History of hypertension, chronic back pain and obesity. EXAM: CHEST  2 VIEW COMPARISON:  Radiographs 04/17/2015 and 04/14/2013. FINDINGS: The heart size and mediastinal contours are stable. Probable chronic central airway thickening and bibasilar atelectasis or scarring. There is no superimposed edema, airspace disease, pleural effusion or pneumothorax. The bones appear unchanged. IMPRESSION: Stable radiographic appearance of the chest with suspected chronic central airway thickening. No acute findings. Electronically Signed   By: Carey BullocksWilliam  Veazey M.D.   On: 12/08/2016 16:17    Procedures Procedures (including critical care time)  Medications Ordered in ED Medications  ipratropium-albuterol (DUONEB) 0.5-2.5 (3) MG/3ML nebulizer solution 3 mL (3 mLs Nebulization Given 12/08/16 1633)  predniSONE (DELTASONE) tablet 60 mg (60 mg Oral Given 12/08/16 1647)  ipratropium-albuterol (DUONEB) 0.5-2.5 (3) MG/3ML nebulizer solution 3 mL (3 mLs Nebulization Given 12/08/16 1756)  albuterol (PROVENTIL) (2.5 MG/3ML) 0.083% nebulizer solution 2.5 mg (2.5 mg Nebulization Given 12/08/16 1756)     Initial Impression / Assessment and Plan / ED Course  I have reviewed the triage vital signs and the nursing notes.  Pertinent labs & imaging results that were available during my care of the patient were reviewed by me and considered in my medical decision making (see chart for details).     Patient with bronchitis and bronchospasm. Patient improved with 2 neb treatments. She will be sent home  with prednisone albuterol and Z-Pak and will follow-up with his PCP  Final Clinical Impressions(s) / ED Diagnoses   Final diagnoses:  Bronchitis    New Prescriptions New Prescriptions   ALBUTEROL (PROVENTIL HFA;VENTOLIN HFA) 108 (90 BASE) MCG/ACT INHALER    Inhale 1-2 puffs into the lungs every 6 (six) hours as needed for wheezing or shortness of breath.   AZITHROMYCIN (ZITHROMAX Z-PAK) 250 MG TABLET    2 po day one, then 1 daily x 4 days   PREDNISONE (DELTASONE) 10 MG TABLET    Take 2 tablets (20 mg total) by mouth daily.     Bethann BerkshireJoseph Magdelyn Roebuck, MD 12/08/16 [phone]

## 2016-12-08 NOTE — Discharge Instructions (Signed)
Follow up with your md next week for recheck °

## 2016-12-08 NOTE — ED Notes (Signed)
Pt told of delay and estimated time of wait for room

## 2016-12-08 NOTE — ED Notes (Signed)
Rt notified of neb tx due

## 2016-12-08 NOTE — ED Triage Notes (Signed)
Pt comes in with productive cough starting 3 days ago. Pt states he will walk short distances and become short of breath. Pt has bilateral expiratory wheezing noted in triage. Denies any pain. Oxygen 96%.

## 2017-01-08 ENCOUNTER — Encounter: Payer: Self-pay | Admitting: Nurse Practitioner

## 2017-01-08 ENCOUNTER — Ambulatory Visit: Payer: Self-pay

## 2017-01-22 ENCOUNTER — Telehealth: Payer: Self-pay | Admitting: Nurse Practitioner

## 2017-01-22 NOTE — Telephone Encounter (Signed)
I left a message asking pt if he's back in town so we can reschedule AWV-I and CPE. VDM (DD)

## 2017-02-02 DIAGNOSIS — M4716 Other spondylosis with myelopathy, lumbar region: Secondary | ICD-10-CM | POA: Diagnosis not present

## 2017-02-27 ENCOUNTER — Telehealth: Payer: Self-pay | Admitting: Nurse Practitioner

## 2017-02-27 NOTE — Telephone Encounter (Signed)
I spoke with the patient to reschedule his AWV and CPE.  He stated that he is taking his grandchildren back to [place] next weekend and will call us back to reschedule after that. VDM (DD)

## 2017-04-19 DIAGNOSIS — M4316 Spondylolisthesis, lumbar region: Secondary | ICD-10-CM | POA: Diagnosis not present

## 2017-05-31 ENCOUNTER — Telehealth: Payer: Self-pay | Admitting: Nurse Practitioner

## 2017-05-31 NOTE — Telephone Encounter (Signed)
I called the patient to schedule AWV and EV.  He stated that he's going out of town and will call back in a couple of weeks to schedule it. VDM (DD)

## 2017-07-25 ENCOUNTER — Telehealth: Payer: Self-pay

## 2017-07-25 NOTE — Telephone Encounter (Signed)
I spoke with patient about the need to schedule an appointment. Patient stated that he planned to remain with this office but he is unable to schedule an appointment at this time due to financial hardships. He stated that he would schedule an appointment as soon as he could afford to pay for it because he does not have insurance at this time.

## 2017-07-25 NOTE — Telephone Encounter (Signed)
noted 

## 2018-01-07 DIAGNOSIS — H00022 Hordeolum internum right lower eyelid: Secondary | ICD-10-CM | POA: Diagnosis not present

## 2018-01-10 DIAGNOSIS — H00022 Hordeolum internum right lower eyelid: Secondary | ICD-10-CM | POA: Diagnosis not present

## 2018-01-14 DIAGNOSIS — M4716 Other spondylosis with myelopathy, lumbar region: Secondary | ICD-10-CM | POA: Diagnosis not present

## 2018-01-14 DIAGNOSIS — Z6841 Body Mass Index (BMI) 40.0 and over, adult: Secondary | ICD-10-CM | POA: Diagnosis not present

## 2018-04-19 DIAGNOSIS — M7989 Other specified soft tissue disorders: Secondary | ICD-10-CM | POA: Diagnosis not present

## 2018-04-19 DIAGNOSIS — Z6841 Body Mass Index (BMI) 40.0 and over, adult: Secondary | ICD-10-CM | POA: Diagnosis not present

## 2018-04-19 DIAGNOSIS — M4716 Other spondylosis with myelopathy, lumbar region: Secondary | ICD-10-CM | POA: Diagnosis not present

## 2018-05-05 IMAGING — DX DG CHEST 2V
2 series · 2 of 2 positions shown · non-contrast
Comparison: Radiographs 04/17/2015 and 04/14/2013.

CLINICAL DATA: Productive cough with shortness of breath for 2
days. History of hypertension, chronic back pain and obesity.

EXAM:
CHEST  2 VIEW

[chest pa]
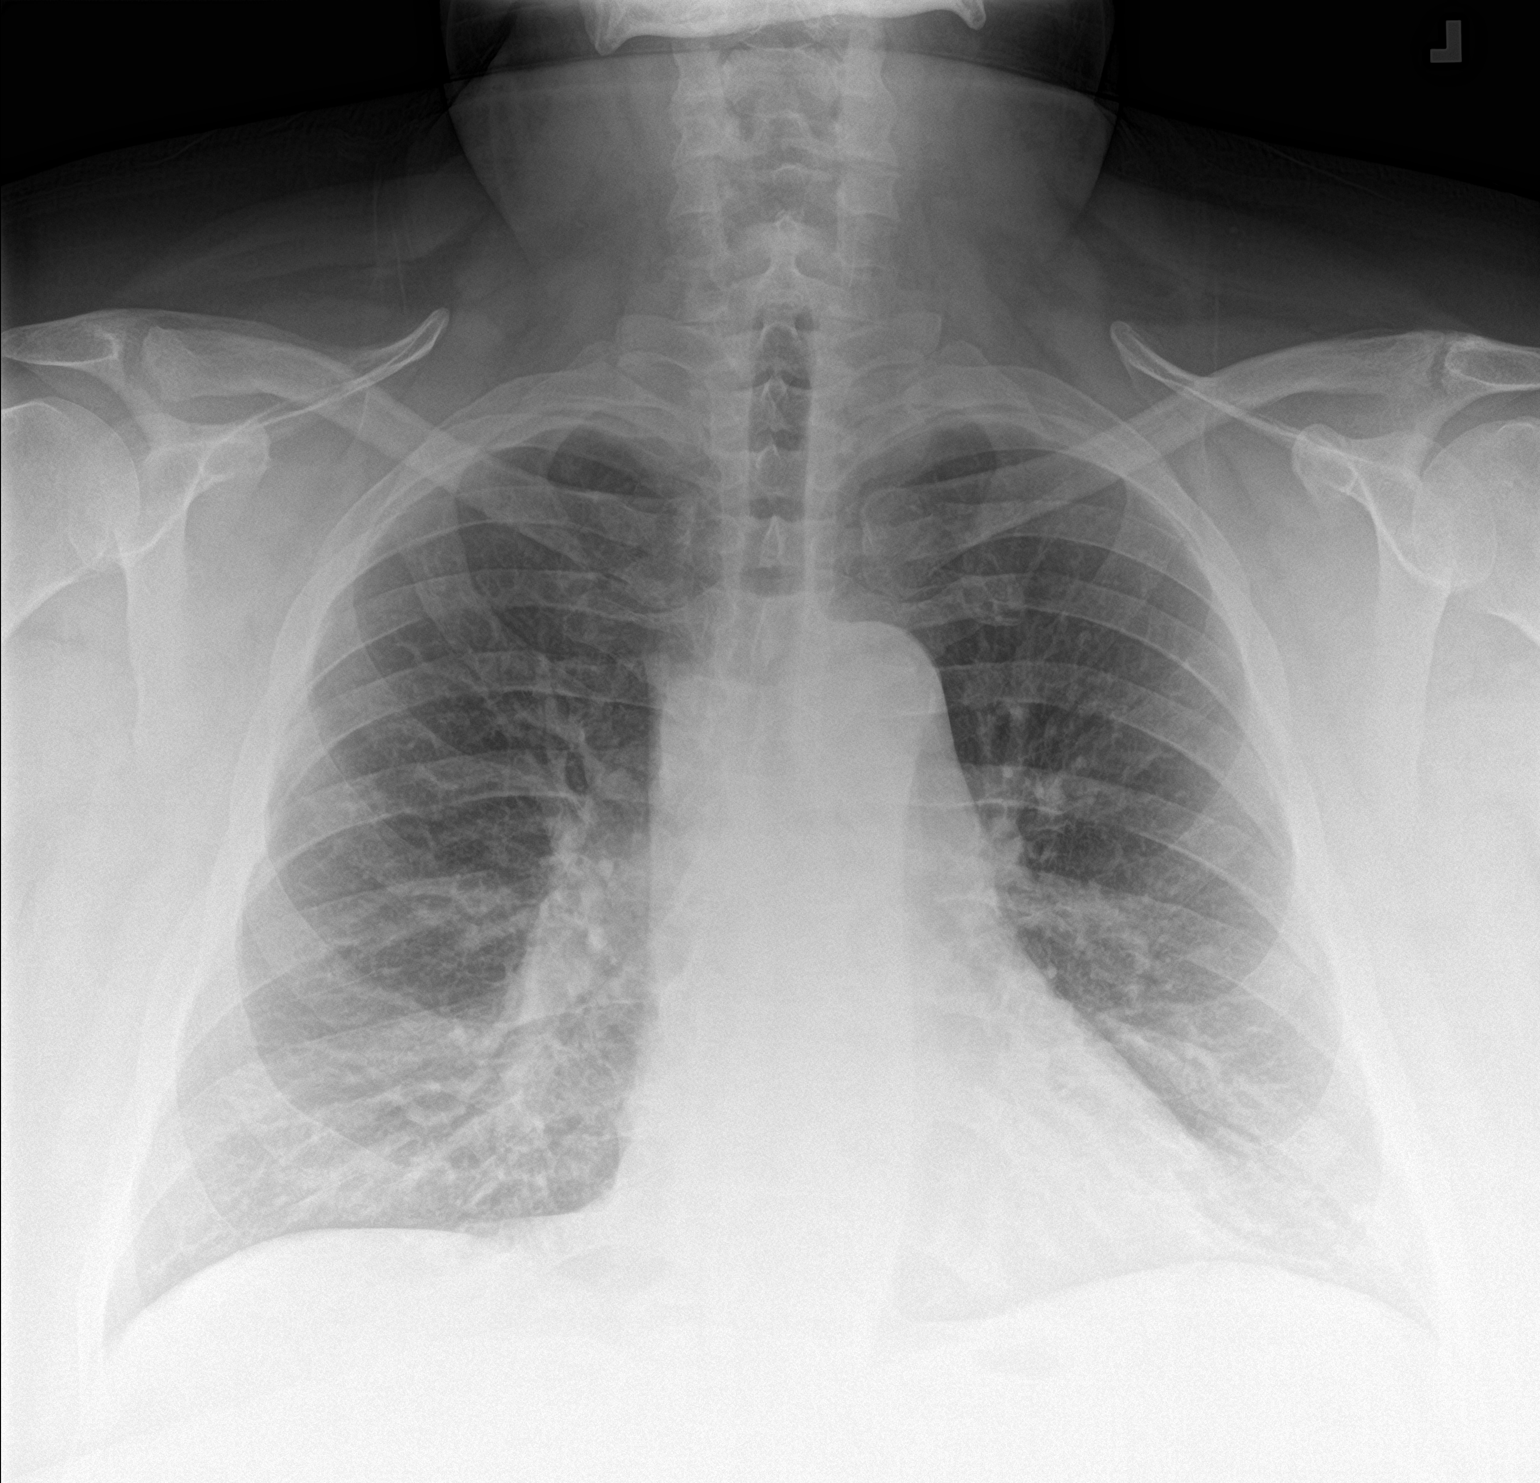

[chest lat]
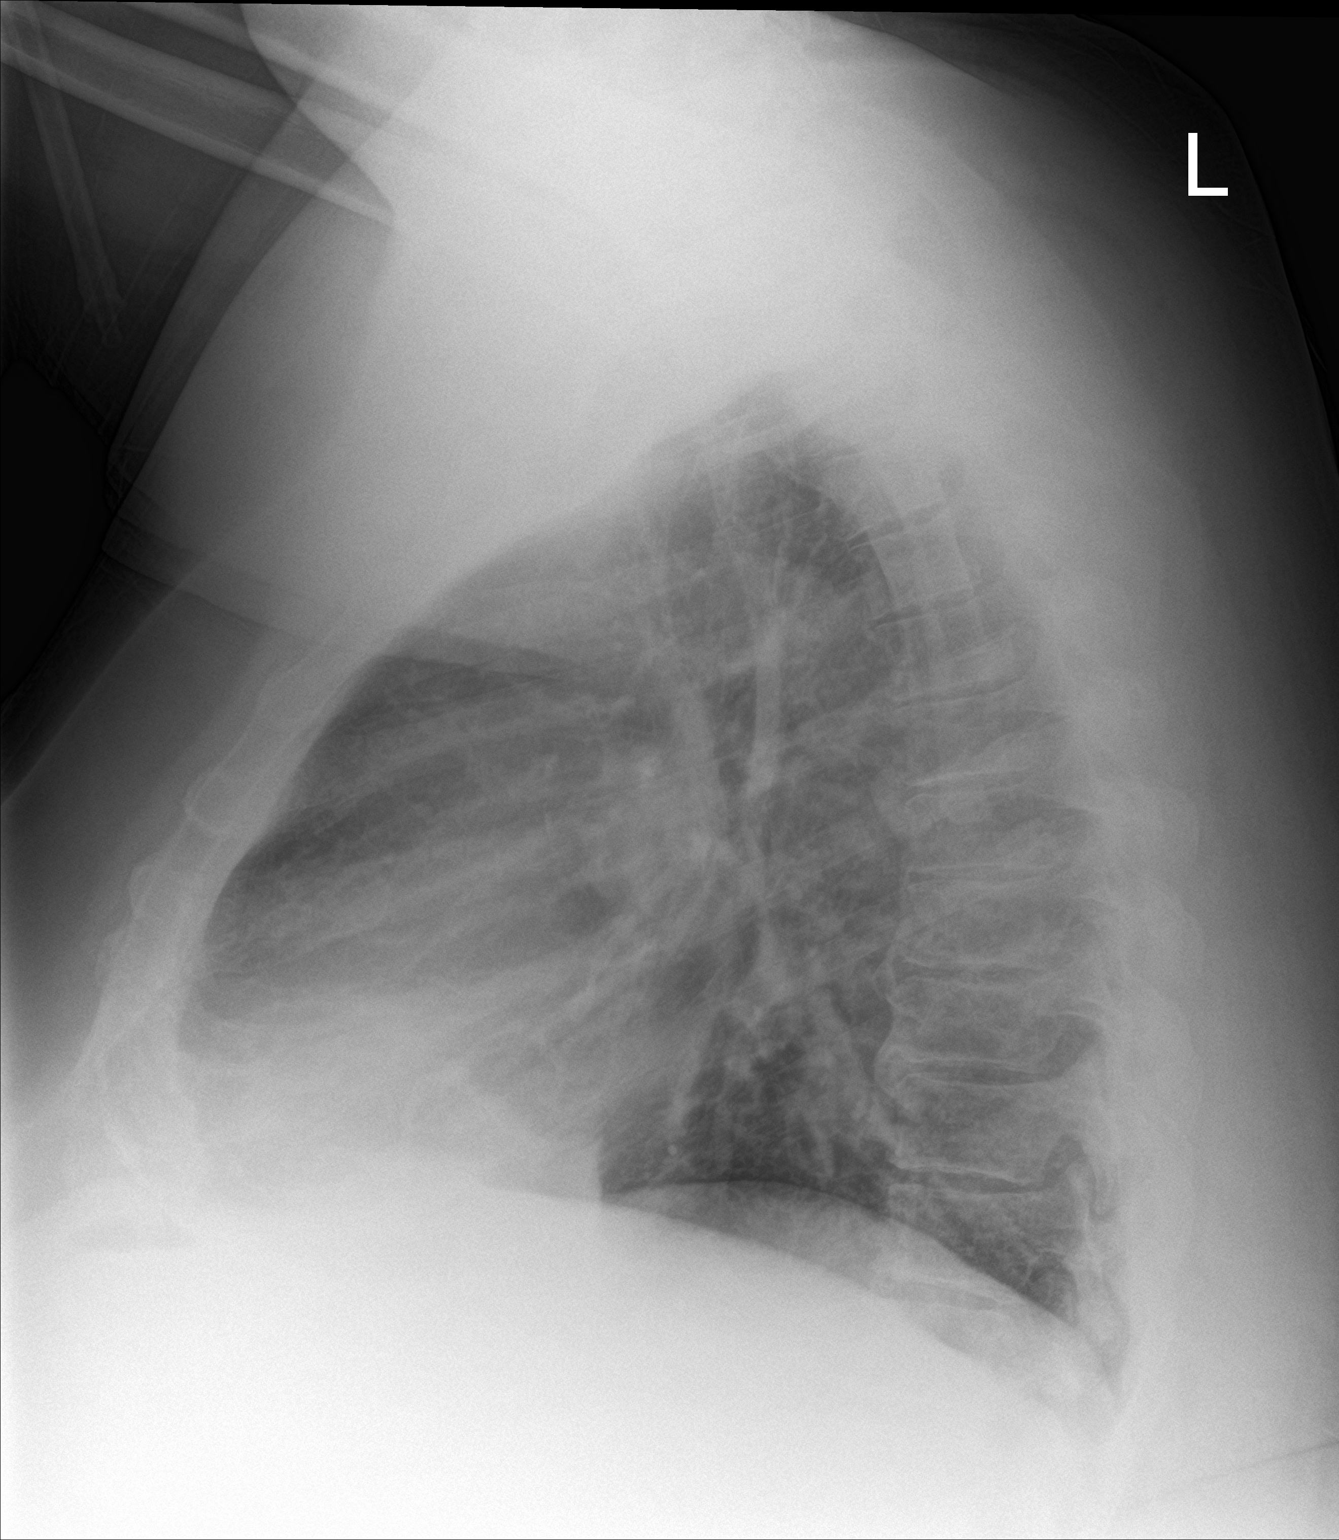

[2 of 2 positions shown; findings below may reference images not displayed]

FINDINGS: The heart size and mediastinal contours are stable. Probable chronic
central airway thickening and bibasilar atelectasis or scarring.
There is no superimposed edema, airspace disease, pleural effusion
or pneumothorax. The bones appear unchanged.
IMPRESSION: Stable radiographic appearance of the chest with suspected chronic
central airway thickening. No acute findings.

## 2018-05-13 ENCOUNTER — Ambulatory Visit: Payer: Commercial Managed Care - HMO

## 2018-05-13 ENCOUNTER — Encounter: Payer: Commercial Managed Care - HMO | Admitting: Nurse Practitioner

## 2018-08-15 DIAGNOSIS — M4316 Spondylolisthesis, lumbar region: Secondary | ICD-10-CM | POA: Diagnosis not present

## 2018-08-15 DIAGNOSIS — M4716 Other spondylosis with myelopathy, lumbar region: Secondary | ICD-10-CM | POA: Diagnosis not present

## 2019-04-03 DIAGNOSIS — R030 Elevated blood-pressure reading, without diagnosis of hypertension: Secondary | ICD-10-CM | POA: Diagnosis not present

## 2019-04-03 DIAGNOSIS — Z6841 Body Mass Index (BMI) 40.0 and over, adult: Secondary | ICD-10-CM | POA: Diagnosis not present

## 2019-04-03 DIAGNOSIS — M545 Low back pain: Secondary | ICD-10-CM | POA: Diagnosis not present

## 2019-11-11 DIAGNOSIS — Z0189 Encounter for other specified special examinations: Secondary | ICD-10-CM | POA: Diagnosis not present

## 2019-11-11 DIAGNOSIS — R600 Localized edema: Secondary | ICD-10-CM | POA: Diagnosis not present

## 2019-11-11 DIAGNOSIS — G894 Chronic pain syndrome: Secondary | ICD-10-CM | POA: Diagnosis not present

## 2019-12-05 DIAGNOSIS — R7301 Impaired fasting glucose: Secondary | ICD-10-CM | POA: Diagnosis not present

## 2019-12-05 DIAGNOSIS — Z0000 Encounter for general adult medical examination without abnormal findings: Secondary | ICD-10-CM | POA: Diagnosis not present

## 2019-12-05 DIAGNOSIS — E039 Hypothyroidism, unspecified: Secondary | ICD-10-CM | POA: Diagnosis not present

## 2019-12-08 DIAGNOSIS — Z0000 Encounter for general adult medical examination without abnormal findings: Secondary | ICD-10-CM | POA: Diagnosis not present

## 2020-02-13 DIAGNOSIS — G894 Chronic pain syndrome: Secondary | ICD-10-CM | POA: Diagnosis not present

## 2020-02-13 DIAGNOSIS — G4733 Obstructive sleep apnea (adult) (pediatric): Secondary | ICD-10-CM | POA: Diagnosis not present

## 2020-02-13 DIAGNOSIS — K219 Gastro-esophageal reflux disease without esophagitis: Secondary | ICD-10-CM | POA: Diagnosis not present

## 2020-07-20 DIAGNOSIS — G894 Chronic pain syndrome: Secondary | ICD-10-CM | POA: Diagnosis not present

## 2020-07-20 DIAGNOSIS — R600 Localized edema: Secondary | ICD-10-CM | POA: Diagnosis not present

## 2020-07-20 DIAGNOSIS — Z0000 Encounter for general adult medical examination without abnormal findings: Secondary | ICD-10-CM | POA: Diagnosis not present

## 2020-07-20 DIAGNOSIS — R7301 Impaired fasting glucose: Secondary | ICD-10-CM | POA: Diagnosis not present

## 2020-07-22 DIAGNOSIS — R600 Localized edema: Secondary | ICD-10-CM | POA: Diagnosis not present

## 2020-07-22 DIAGNOSIS — G894 Chronic pain syndrome: Secondary | ICD-10-CM | POA: Diagnosis not present

## 2020-07-22 DIAGNOSIS — K219 Gastro-esophageal reflux disease without esophagitis: Secondary | ICD-10-CM | POA: Diagnosis not present

## 2020-07-22 DIAGNOSIS — R7301 Impaired fasting glucose: Secondary | ICD-10-CM | POA: Diagnosis not present

## 2020-10-19 DIAGNOSIS — R600 Localized edema: Secondary | ICD-10-CM | POA: Diagnosis not present

## 2020-10-19 DIAGNOSIS — K219 Gastro-esophageal reflux disease without esophagitis: Secondary | ICD-10-CM | POA: Diagnosis not present

## 2020-10-19 DIAGNOSIS — L02213 Cutaneous abscess of chest wall: Secondary | ICD-10-CM | POA: Diagnosis not present

## 2020-10-19 DIAGNOSIS — G894 Chronic pain syndrome: Secondary | ICD-10-CM | POA: Diagnosis not present

## 2021-01-13 DIAGNOSIS — E785 Hyperlipidemia, unspecified: Secondary | ICD-10-CM | POA: Diagnosis not present

## 2021-01-13 DIAGNOSIS — R7301 Impaired fasting glucose: Secondary | ICD-10-CM | POA: Diagnosis not present

## 2021-01-24 DIAGNOSIS — E785 Hyperlipidemia, unspecified: Secondary | ICD-10-CM | POA: Diagnosis not present

## 2021-01-24 DIAGNOSIS — G894 Chronic pain syndrome: Secondary | ICD-10-CM | POA: Diagnosis not present

## 2021-01-24 DIAGNOSIS — R7301 Impaired fasting glucose: Secondary | ICD-10-CM | POA: Diagnosis not present

## 2021-02-17 DIAGNOSIS — G894 Chronic pain syndrome: Secondary | ICD-10-CM | POA: Diagnosis not present

## 2021-02-17 DIAGNOSIS — W57XXXA Bitten or stung by nonvenomous insect and other nonvenomous arthropods, initial encounter: Secondary | ICD-10-CM | POA: Diagnosis not present

## 2021-08-02 DIAGNOSIS — E785 Hyperlipidemia, unspecified: Secondary | ICD-10-CM | POA: Diagnosis not present

## 2021-08-02 DIAGNOSIS — G894 Chronic pain syndrome: Secondary | ICD-10-CM | POA: Diagnosis not present

## 2021-08-02 DIAGNOSIS — R030 Elevated blood-pressure reading, without diagnosis of hypertension: Secondary | ICD-10-CM | POA: Diagnosis not present

## 2021-08-02 DIAGNOSIS — R7301 Impaired fasting glucose: Secondary | ICD-10-CM | POA: Diagnosis not present

## 2021-08-02 DIAGNOSIS — L608 Other nail disorders: Secondary | ICD-10-CM | POA: Diagnosis not present

## 2021-08-02 DIAGNOSIS — R600 Localized edema: Secondary | ICD-10-CM | POA: Diagnosis not present

## 2021-11-28 DIAGNOSIS — Z125 Encounter for screening for malignant neoplasm of prostate: Secondary | ICD-10-CM | POA: Diagnosis not present

## 2021-11-28 DIAGNOSIS — R7301 Impaired fasting glucose: Secondary | ICD-10-CM | POA: Diagnosis not present

## 2021-11-30 DIAGNOSIS — R030 Elevated blood-pressure reading, without diagnosis of hypertension: Secondary | ICD-10-CM | POA: Diagnosis not present

## 2021-11-30 DIAGNOSIS — Z6841 Body Mass Index (BMI) 40.0 and over, adult: Secondary | ICD-10-CM | POA: Diagnosis not present

## 2021-11-30 DIAGNOSIS — G894 Chronic pain syndrome: Secondary | ICD-10-CM | POA: Diagnosis not present

## 2021-11-30 DIAGNOSIS — E785 Hyperlipidemia, unspecified: Secondary | ICD-10-CM | POA: Diagnosis not present

## 2021-11-30 DIAGNOSIS — R7301 Impaired fasting glucose: Secondary | ICD-10-CM | POA: Diagnosis not present

## 2021-12-13 DIAGNOSIS — I10 Essential (primary) hypertension: Secondary | ICD-10-CM | POA: Diagnosis not present

## 2021-12-13 DIAGNOSIS — M109 Gout, unspecified: Secondary | ICD-10-CM | POA: Diagnosis not present

## 2022-04-28 DIAGNOSIS — R197 Diarrhea, unspecified: Secondary | ICD-10-CM | POA: Diagnosis not present

## 2022-04-28 DIAGNOSIS — R0600 Dyspnea, unspecified: Secondary | ICD-10-CM | POA: Diagnosis not present

## 2022-04-28 DIAGNOSIS — R519 Headache, unspecified: Secondary | ICD-10-CM | POA: Diagnosis not present

## 2022-04-28 DIAGNOSIS — U071 COVID-19: Secondary | ICD-10-CM | POA: Diagnosis not present

## 2022-04-28 DIAGNOSIS — R509 Fever, unspecified: Secondary | ICD-10-CM | POA: Diagnosis not present

## 2022-05-30 DIAGNOSIS — R7301 Impaired fasting glucose: Secondary | ICD-10-CM | POA: Diagnosis not present

## 2022-05-30 DIAGNOSIS — E785 Hyperlipidemia, unspecified: Secondary | ICD-10-CM | POA: Diagnosis not present

## 2022-05-30 DIAGNOSIS — I10 Essential (primary) hypertension: Secondary | ICD-10-CM | POA: Diagnosis not present

## 2022-05-31 DIAGNOSIS — J069 Acute upper respiratory infection, unspecified: Secondary | ICD-10-CM | POA: Diagnosis not present

## 2022-05-31 DIAGNOSIS — R059 Cough, unspecified: Secondary | ICD-10-CM | POA: Diagnosis not present

## 2022-05-31 DIAGNOSIS — R0981 Nasal congestion: Secondary | ICD-10-CM | POA: Diagnosis not present

## 2022-05-31 DIAGNOSIS — R0789 Other chest pain: Secondary | ICD-10-CM | POA: Diagnosis not present

## 2022-06-06 DIAGNOSIS — Z6841 Body Mass Index (BMI) 40.0 and over, adult: Secondary | ICD-10-CM | POA: Diagnosis not present

## 2022-06-06 DIAGNOSIS — R7301 Impaired fasting glucose: Secondary | ICD-10-CM | POA: Diagnosis not present

## 2022-06-06 DIAGNOSIS — R600 Localized edema: Secondary | ICD-10-CM | POA: Diagnosis not present

## 2022-06-06 DIAGNOSIS — R030 Elevated blood-pressure reading, without diagnosis of hypertension: Secondary | ICD-10-CM | POA: Diagnosis not present

## 2022-06-06 DIAGNOSIS — Z0001 Encounter for general adult medical examination with abnormal findings: Secondary | ICD-10-CM | POA: Diagnosis not present

## 2022-06-06 DIAGNOSIS — G894 Chronic pain syndrome: Secondary | ICD-10-CM | POA: Diagnosis not present

## 2022-06-06 DIAGNOSIS — E785 Hyperlipidemia, unspecified: Secondary | ICD-10-CM | POA: Diagnosis not present

## 2022-11-29 DIAGNOSIS — R7301 Impaired fasting glucose: Secondary | ICD-10-CM | POA: Diagnosis not present

## 2022-11-29 DIAGNOSIS — E785 Hyperlipidemia, unspecified: Secondary | ICD-10-CM | POA: Diagnosis not present

## 2022-12-04 DIAGNOSIS — Z0000 Encounter for general adult medical examination without abnormal findings: Secondary | ICD-10-CM | POA: Diagnosis not present

## 2022-12-05 DIAGNOSIS — Z6841 Body Mass Index (BMI) 40.0 and over, adult: Secondary | ICD-10-CM | POA: Diagnosis not present

## 2022-12-05 DIAGNOSIS — R600 Localized edema: Secondary | ICD-10-CM | POA: Diagnosis not present

## 2022-12-05 DIAGNOSIS — G894 Chronic pain syndrome: Secondary | ICD-10-CM | POA: Diagnosis not present

## 2022-12-05 DIAGNOSIS — E785 Hyperlipidemia, unspecified: Secondary | ICD-10-CM | POA: Diagnosis not present

## 2022-12-05 DIAGNOSIS — I10 Essential (primary) hypertension: Secondary | ICD-10-CM | POA: Diagnosis not present

## 2022-12-05 DIAGNOSIS — R7301 Impaired fasting glucose: Secondary | ICD-10-CM | POA: Diagnosis not present

## 2022-12-05 DIAGNOSIS — Z79899 Other long term (current) drug therapy: Secondary | ICD-10-CM | POA: Diagnosis not present

## 2023-03-08 DIAGNOSIS — Z6841 Body Mass Index (BMI) 40.0 and over, adult: Secondary | ICD-10-CM | POA: Diagnosis not present

## 2023-03-08 DIAGNOSIS — M25561 Pain in right knee: Secondary | ICD-10-CM | POA: Diagnosis not present

## 2023-03-08 DIAGNOSIS — M545 Low back pain, unspecified: Secondary | ICD-10-CM | POA: Diagnosis not present

## 2023-03-08 DIAGNOSIS — G894 Chronic pain syndrome: Secondary | ICD-10-CM | POA: Diagnosis not present

## 2023-03-08 DIAGNOSIS — M25551 Pain in right hip: Secondary | ICD-10-CM | POA: Diagnosis not present

## 2023-03-08 DIAGNOSIS — M25562 Pain in left knee: Secondary | ICD-10-CM | POA: Diagnosis not present

## 2023-05-31 DIAGNOSIS — Z125 Encounter for screening for malignant neoplasm of prostate: Secondary | ICD-10-CM | POA: Diagnosis not present

## 2023-05-31 DIAGNOSIS — E785 Hyperlipidemia, unspecified: Secondary | ICD-10-CM | POA: Diagnosis not present

## 2023-05-31 DIAGNOSIS — R7301 Impaired fasting glucose: Secondary | ICD-10-CM | POA: Diagnosis not present

## 2023-06-20 DIAGNOSIS — R809 Proteinuria, unspecified: Secondary | ICD-10-CM | POA: Diagnosis not present

## 2023-06-20 DIAGNOSIS — I10 Essential (primary) hypertension: Secondary | ICD-10-CM | POA: Diagnosis not present

## 2023-06-20 DIAGNOSIS — R7301 Impaired fasting glucose: Secondary | ICD-10-CM | POA: Diagnosis not present

## 2023-06-20 DIAGNOSIS — R600 Localized edema: Secondary | ICD-10-CM | POA: Diagnosis not present

## 2023-06-20 DIAGNOSIS — E785 Hyperlipidemia, unspecified: Secondary | ICD-10-CM | POA: Diagnosis not present

## 2023-06-20 DIAGNOSIS — Z6841 Body Mass Index (BMI) 40.0 and over, adult: Secondary | ICD-10-CM | POA: Diagnosis not present

## 2023-06-20 DIAGNOSIS — G894 Chronic pain syndrome: Secondary | ICD-10-CM | POA: Diagnosis not present

## 2023-06-26 ENCOUNTER — Encounter: Payer: Self-pay | Admitting: *Deleted

## 2023-11-26 ENCOUNTER — Telehealth: Payer: Self-pay

## 2023-11-26 NOTE — Progress Notes (Signed)
   11/26/2023  Patient ID: Jesse Meyers, male   DOB: June 08, 1961, 63 y.o.   MRN: [id number]   Patient appeared on insurance report for not passing the quality metrics in 2024:  Medication Adherence for Hypertension [facility])   Outreach to the patient was Successful.   Meds Tracking:  -Olmesartan 40 mg - Last fill 30DS on 06/20/23, BP 140/80 on 06/20/23. Does not qualify for metric yet this year, likely overdue. Confirmed he was out of medication. Has been checking BP 3-4 times per week. Provided two recent BP: 148/71 yesterday, 154/72 the day before and has been between 140-60/70s pretty consistently  Plan:  He agreed to a follow up call on 12/10/23 at 11:00 am to review BP. Since our next steps would be addition of amlodipine or hydrochlorothiazide which can be combined with olmesartan, will have him fill another 30 days, check BP regularly and adjust after follow up if uncontrolled.   Flint Hummer, PharmD

## 2023-12-24 ENCOUNTER — Encounter (INDEPENDENT_AMBULATORY_CARE_PROVIDER_SITE_OTHER): Payer: Self-pay | Admitting: *Deleted

## 2023-12-25 DIAGNOSIS — R7301 Impaired fasting glucose: Secondary | ICD-10-CM | POA: Diagnosis not present

## 2023-12-25 DIAGNOSIS — E785 Hyperlipidemia, unspecified: Secondary | ICD-10-CM | POA: Diagnosis not present

## 2024-01-01 DIAGNOSIS — R7301 Impaired fasting glucose: Secondary | ICD-10-CM | POA: Diagnosis not present

## 2024-01-01 DIAGNOSIS — Z6841 Body Mass Index (BMI) 40.0 and over, adult: Secondary | ICD-10-CM | POA: Diagnosis not present

## 2024-01-01 DIAGNOSIS — I10 Essential (primary) hypertension: Secondary | ICD-10-CM | POA: Diagnosis not present

## 2024-01-01 DIAGNOSIS — R600 Localized edema: Secondary | ICD-10-CM | POA: Diagnosis not present

## 2024-01-01 DIAGNOSIS — G894 Chronic pain syndrome: Secondary | ICD-10-CM | POA: Diagnosis not present

## 2024-01-01 DIAGNOSIS — E785 Hyperlipidemia, unspecified: Secondary | ICD-10-CM | POA: Diagnosis not present

## 2024-01-01 DIAGNOSIS — R809 Proteinuria, unspecified: Secondary | ICD-10-CM | POA: Diagnosis not present

## 2024-03-07 ENCOUNTER — Telehealth: Payer: Self-pay

## 2024-03-07 NOTE — Telephone Encounter (Signed)
 Up to date on meds, next review in September

## 2024-04-02 DIAGNOSIS — Z6841 Body Mass Index (BMI) 40.0 and over, adult: Secondary | ICD-10-CM | POA: Diagnosis not present

## 2024-04-02 DIAGNOSIS — I10 Essential (primary) hypertension: Secondary | ICD-10-CM | POA: Diagnosis not present

## 2024-04-02 DIAGNOSIS — G894 Chronic pain syndrome: Secondary | ICD-10-CM | POA: Diagnosis not present

## 2024-06-03 DIAGNOSIS — Z1211 Encounter for screening for malignant neoplasm of colon: Secondary | ICD-10-CM | POA: Diagnosis not present

## 2024-06-10 LAB — COLOGUARD: COLOGUARD: NEGATIVE

## 2024-07-01 DIAGNOSIS — R7301 Impaired fasting glucose: Secondary | ICD-10-CM | POA: Diagnosis not present

## 2024-07-01 DIAGNOSIS — E785 Hyperlipidemia, unspecified: Secondary | ICD-10-CM | POA: Diagnosis not present

## 2024-07-01 DIAGNOSIS — Z125 Encounter for screening for malignant neoplasm of prostate: Secondary | ICD-10-CM | POA: Diagnosis not present

## 2024-07-07 DIAGNOSIS — Z0000 Encounter for general adult medical examination without abnormal findings: Secondary | ICD-10-CM | POA: Diagnosis not present

## 2024-07-07 DIAGNOSIS — R7301 Impaired fasting glucose: Secondary | ICD-10-CM | POA: Diagnosis not present

## 2024-07-07 DIAGNOSIS — E785 Hyperlipidemia, unspecified: Secondary | ICD-10-CM | POA: Diagnosis not present

## 2024-07-07 DIAGNOSIS — G894 Chronic pain syndrome: Secondary | ICD-10-CM | POA: Diagnosis not present

## 2024-07-07 DIAGNOSIS — W541XXA Struck by dog, initial encounter: Secondary | ICD-10-CM | POA: Diagnosis not present

## 2024-07-07 DIAGNOSIS — Z0001 Encounter for general adult medical examination with abnormal findings: Secondary | ICD-10-CM | POA: Diagnosis not present

## 2024-07-07 DIAGNOSIS — I10 Essential (primary) hypertension: Secondary | ICD-10-CM | POA: Diagnosis not present

## 2024-07-07 DIAGNOSIS — S81802A Unspecified open wound, left lower leg, initial encounter: Secondary | ICD-10-CM | POA: Diagnosis not present

## 2024-07-10 ENCOUNTER — Other Ambulatory Visit: Payer: Self-pay

## 2024-07-10 ENCOUNTER — Ambulatory Visit (HOSPITAL_COMMUNITY): Attending: Nurse Practitioner | Admitting: Physical Therapy

## 2024-07-10 DIAGNOSIS — I890 Lymphedema, not elsewhere classified: Secondary | ICD-10-CM | POA: Diagnosis present

## 2024-07-10 NOTE — Therapy (Signed)
 OUTPATIENT PHYSICAL THERAPY LYMPHEDEMA EVALUATION  Patient Name: Jesse Meyers MRN: [id number] DOB:12-13-1960, 63 y.o., male Today's Date: 07/10/2024  END OF SESSION:  PT End of Session - 07/10/24 1052     Visit Number 1    Number of Visits 8    Date for Recertification  08/09/24    Authorization Type [employer]- auth placed    PT Start Time 0915    PT Stop Time 1004    PT Time Calculation (min) 49 min    Activity Tolerance Patient tolerated treatment well    Behavior During Therapy WFL for tasks assessed/performed          Past Medical History:  Diagnosis Date   Allergic rhinitis    Allergic rhinitis due to pollen    Chronic back pain 1993 &1995   s/p back surgery   Edema    GERD (gastroesophageal reflux disease)    Hemorrhoids    Hiatal hernia    Hyperglycemia    Hypertension    Morbid obesity (HCC)    Unsteady gait    Past Surgical History:  Procedure Laterality Date   BACK SURGERY  1993 &1995    Dr Gaither   Patient Active Problem List   Diagnosis Date Noted   Chronic low back pain 04/27/2015   OBESITY, MORBID 03/19/2009   GERD 03/19/2009    PCP: Joeann Browning, FNP  REFERRING PROVIDER: Joeann Browning, FNP  REFERRING DIAG: T14.8XXA (ICD-10-CM) - Other injury of unspecified body region, initial encounter  THERAPY DIAG:  lymphedema  Rationale for Evaluation and Treatment: Rehabilitation  ONSET DATE: 02/09/24  SUBJECTIVE:                                                                                                                                                                                           SUBJECTIVE STATEMENT: Pt states that he has been having progressive swelling in his legs for years; Left greater than Right.  He noticed a discoloration in the back of his Lt leg in July.  He has been on antibiotics, tried different creams without change.  His provider requested that he be seen at this clinic.  PERTINENT HISTORY: Pt has noted edema in  abdominal, testicle and LE   PAIN:  Are you having pain? No PRECAUTIONS: Other: cellulitis     WEIGHT BEARING RESTRICTIONS: No  FALLS:  Has patient fallen in last 6 months? No  LIVING ENVIRONMENT: Lives with: lives with their family Lives in: House/apartment OCCUPATION: disabled   PRIOR LEVEL OF FUNCTION: Independent with basic ADLs  PATIENT GOALS: less swelling, discoloration to go away.    OBJECTIVE: Note:  Objective measures were completed at Evaluation unless otherwise noted.  COGNITION: Overall cognitive status: Within functional limits for tasks assessed   PALPATION: Noted induration in abdominal and LE B; pt verbalized testicle edema    POSTURE: significant protruding abdominal   LYMPHEDEMA ASSESSMENTS:      (Blank rows = not tested)   LE LANDMARK RIGHT eval  Around trunk at umbilicus   Around trunk at widest area of hips   At groin   30 cm proximal to suprapatella   20 cm proximal to suprapatella   10 cm proximal to suprapatella   At midpatella / popliteal crease   30 cm proximal to floor at lateral plantar foot   20 cm proximal to floor at lateral plantar foot   10 cm proximal to floor at lateral plantar foot   Circumference of ankle/heel   5 cm proximal to 1st MTP joint   Across MTP joint   Around proximal great toe   (Blank rows = not tested)  LE LANDMARK LEFT eval  Around trunk at umbilicus   Around trunk at widest area of hips   At groin   30 cm proximal to suprapatella   20 cm proximal to suprapatella   10 cm proximal to suprapatella   At midpatella / popliteal crease   30 cm proximal to floor at lateral plantar foot 58  20 cm proximal to floor at lateral plantar foot 55.4  10 cm proximal to floor at lateral plantar foot 39.5     TODAY'S TREATMENT:                                                                                                                              DATE: 07/10/24  Foam cut for LE.  Cleansing and  moisturizing of Lt LE Manual decongestive techniques to Lt LE followed by application of profore lite compression with 1/2 foam.  PT given exercises to improve lymphatic circulation.    PATIENT EDUCATION:  Education details: Educated pt that therapist are unable to dx, however, he most likely has lymphedema and the skin discoloration is due to the induration strangling the skin cells.  Explained that we do not have an order for lymphedema, therefore we will only be treating the edema in his LT leg and working on improving his skin health.  Therapist suggested compression t-shirts, compression underwear and biker shorts to attempt to decrease edema in abdominal and testicle area.  Therapist gave pt exercises to improve lymphatic circulation.  Person educated: Patient Education method: Explanation, Verbal cues, and Handouts Education comprehension: verbalized understanding  HOME EXERCISE PROGRAM: Access Code: [email or web address] URL: [email or web address] Date: 07/10/2024 Prepared by: Montie Metro  Exercises - Seated Diaphragmatic Breathing  - 1 x daily - 7 x weekly - 10 reps - 5 hold - Seated Cervical Sidebending AROM  - 1 x daily - 7 x weekly - 1 sets - 10 reps - 2-3 hold - Seated Cervical Rotation AROM  -  1 x daily - 7 x weekly - 1 sets - 10 reps - 2-3 hold - Seated Cervical Extension AROM  - 1 x daily - 7 x weekly - 1 sets - 10 reps - 2-3 hold - Seated Cervical Retraction  - 1 x daily - 7 x weekly - 1 sets - 10 reps - 2-3 hold - Shoulder Rolls in Sitting  - 1 x daily - 7 x weekly - 1 sets - 10 reps - 2-3 hold - Seated Sidebending Arms Overhead  - 1 x daily - 7 x weekly - 1 sets - 10 reps - 2-3 hold - Seated March  - 1 x daily - 7 x weekly - 1 sets - 10 reps - 2-3 hold - Seated Hip Abduction  - 1 x daily - 7 x weekly - 1 sets - 10 reps - 2-3 hold - Seated Long Arc Quad  - 1 x daily - 7 x weekly - 1 sets - 10 reps - 2-3 hold - Seated Ankle Dorsiflexion AROM  - 1 x daily -  7 x weekly - 1 sets - 10 reps - 2-3 hold - Seated Toe Curl  - 1 x daily - 7 x weekly - 1 sets - 10 reps - 2-3 hold  ASSESSMENT:  CLINICAL IMPRESSION: Patient is a 63 y.o. male who was seen today for physical therapy evaluation and treatment for T14.8XXA (ICD-10-CM) - Other injury of unspecified body .region, initial encounter. Upon examining the pt it is noted that he has increased induration in his abdominal, testicle and LE area. There is skin discoloration of his Lt LE which is why he is here.  He has noted increased redness in the posterior aspect of his leg and his skin is dry and flaking.  Mr. Tindel most likely has lymphedema.  We will work on decreasing his LE edema, have him complete exercises to attempt to increase his lymphatic circulation and ultimately attempt to get the pt juxtalite B as well as an abdominal/LE pump.  The pt has been elevating his legs since July without improvement.  Mr. Lundberg will benefit from skilled therapy to decrease his edema to improve his skin health to avoid cellulitis.  If following this episode the pt desires to have full lymphedema therapy he will need to obtain a lymphedema referral from his provider.  OBJECTIVE IMPAIRMENTS: decreased mobility, difficulty walking, increased edema, and obesity.   ACTIVITY LIMITATIONS: stairs, bed mobility, and locomotion level  PERSONAL FACTORS: Fitness are also affecting patient's functional outcome.   REHAB POTENTIAL: Good  CLINICAL DECISION MAKING: Evolving/moderate complexity  EVALUATION COMPLEXITY: Moderate  GOALS: Goals reviewed with patient? Yes  SHORT TERM GOALS: Target date: 07/31/24  PT to be completing exercises to improve lymphatic circulation  Baseline: Goal status: INITIAL  2.  Pt skin to no longer be dry to reduce risk of cellulitis Baseline:  Goal status: INITIAL  3.  Pt size at measured intakes to be decreased by 2 cm to decrease risk of cellulitis  Baseline:  Goal status:  INITIAL   LONG TERM GOALS: Target date: 08/29/23 extended due to holidays   Pt size at measured intakes to be decreased by 4 cm to decrease risk of cellulitis Baseline:  Goal status: INITIAL  2.  PT discoloration to be decreased by 75% to show improved skin health.  Baseline:  Goal status: INITIAL  3.  PT to note that it is easier to raise Lt LE onto the bed .  Baseline:  Goal status: INITIAL   PLAN:  PT FREQUENCY: 2x/week  PT DURATION: 7 weeks  PLANNED INTERVENTIONS: 97110-Therapeutic exercises, 97535- Self Care, 02859- Manual therapy, and Patient/Family education  PLAN FOR NEXT SESSION: PT to receive decongestive techniques, answer questions on exercise and compression bandaging.  May increase to profore vs. Profore lite if no problems.  Once pt has reduced volume measure for compression garment pt may benefit more from juxtafit as doubtful with his size and hx of back surgery if he will be able to don normal compression garment.   Montie Metro, PT CLT [phone]  07/10/2024, 10:54 AM   [employer] Auth Request Treatment Start Date: 07/10/24  Referring diagnosis code (ICD 10)? T14.8XXA (ICD-10-CM) - Other injury of unspecified body region, initial encounter Treatment diagnosis codes (ICD 10)? (if different than referring diagnosis) 189-lymphedema  What was this (referring dx) caused by? []  Surgery []  Fall []  Ongoing issue []  Arthritis [x]  Other: ____________  Laterality: []  Rt []  Lt [x]  Both; LT greatest involved and will only treat Lt at this time.  If pt wants to return for lymphedema treatment he will let us  know   Deficits: []  Pain []  Stiffness []  Weakness [x]  Edema []  Balance Deficits []  Coordination []  Gait Disturbance []  ROM [x]  Other-decreased skin integrity.      CPT codes: See Planned Interventions listed in the Plan section of the Evaluation.

## 2024-07-14 ENCOUNTER — Ambulatory Visit (HOSPITAL_COMMUNITY): Admitting: Physical Therapy

## 2024-07-14 DIAGNOSIS — I890 Lymphedema, not elsewhere classified: Secondary | ICD-10-CM | POA: Diagnosis not present

## 2024-07-14 NOTE — Therapy (Signed)
 OUTPATIENT PHYSICAL THERAPY LYMPHEDEMA TREATMENT  Patient Name: Jesse Meyers MRN: [id number] DOB:[birth year], 63 y.o., male Today's Date: 07/14/2024  END OF SESSION:  PT End of Session - 07/14/24 1204     Visit Number 2    Number of Visits 8    Date for Recertification  08/09/24    Authorization Type [employer]- auth placed    PT Start Time 1115    PT Stop Time 1145    PT Time Calculation (min) 30 min    Activity Tolerance Patient tolerated treatment well    Behavior During Therapy [facility] for tasks assessed/performed           Past Medical History:  Diagnosis Date   Allergic rhinitis    Allergic rhinitis due to pollen    Chronic back pain 1993 &1995   s/p back surgery   Edema    GERD (gastroesophageal reflux disease)    Hemorrhoids    Hiatal hernia    Hyperglycemia    Hypertension    Morbid obesity (HCC)    Unsteady gait    Past Surgical History:  Procedure Laterality Date   BACK SURGERY  1993 &1995    Dr Gaither   Patient Active Problem List   Diagnosis Date Noted   Chronic low back pain 04/27/2015   OBESITY, MORBID 03/19/2009   GERD 03/19/2009    PCP: Joeann Browning, FNP  REFERRING PROVIDER: Joeann Browning, FNP  REFERRING DIAG: T14.8XXA (ICD-10-CM) - Other injury of unspecified body region, initial encounter  THERAPY DIAG:  lymphedema  Rationale for Evaluation and Treatment: Rehabilitation  ONSET DATE: 02/09/24  SUBJECTIVE:                                                                                                                                                                                           SUBJECTIVE STATEMENT: Pt reports his Lt leg has really went down. Reports he just removed the bandages last night.  States his daughter measured his leg and ordered him some compression socks from [employer] that should be here tomorrow.    PERTINENT HISTORY: Pt has noted edema in abdominal, testicle and LE   PAIN:  Are you having pain? No PRECAUTIONS:  Other: cellulitis     WEIGHT BEARING RESTRICTIONS: No  FALLS:  Has patient fallen in last 6 months? No  LIVING ENVIRONMENT: Lives with: lives with their family Lives in: House/apartment OCCUPATION: disabled   PRIOR LEVEL OF FUNCTION: Independent with basic ADLs  PATIENT GOALS: less swelling, discoloration to go away.    OBJECTIVE: Note: Objective measures were completed at Evaluation unless otherwise noted.  COGNITION: Overall cognitive status:  Within functional limits for tasks assessed   PALPATION: Noted induration in abdominal and LE B; pt verbalized testicle edema    POSTURE: significant protruding abdominal   LYMPHEDEMA ASSESSMENTS:    (Blank rows = not tested)   LE LANDMARK RIGHT eval Right 07/14/24  Around trunk at umbilicus    Around trunk at widest area of hips    At groin    30 cm proximal to suprapatella    20 cm proximal to suprapatella    10 cm proximal to suprapatella    At midpatella / popliteal crease    30 cm proximal to floor at lateral plantar foot  48  20 cm proximal to floor at lateral plantar foot  37.8  10 cm proximal to floor at lateral plantar foot  31  Circumference of ankle/heel  26.7  5 cm proximal to 1st MTP joint  27  Across MTP joint  25.5  Around proximal great toe  8.2  (Blank rows = not tested)   LE LANDMARK LEFT eval Right 07/14/24  Around trunk at umbilicus     Around trunk at widest area of hips     At groin     30 cm proximal to suprapatella     20 cm proximal to suprapatella     10 cm proximal to suprapatella     At midpatella / popliteal crease     30 cm proximal to floor at lateral plantar foot  58 56.8  20 cm proximal to floor at lateral plantar foot  55.4 47.4  10 cm proximal to floor at lateral plantar foot  39.5 37  Circumference of ankle/heel   39  5 cm proximal to 1st MTP joint   27.5  Across MTP joint   25.6  Around proximal great toe   8.3      TODAY'S TREATMENT:                                                                                                                               DATE:  07/14/24 Education on keeping LE's moisturized Manual to Lt LE Moisturized bil LE Profore applied to Lt LE  07/10/24  Foam cut for LE.  Cleansing and moisturizing of Lt LE Manual decongestive techniques to Lt LE followed by application of profore lite compression with 1/2 foam.  PT given exercises to improve lymphatic circulation.    PATIENT EDUCATION:  Education details: Educated pt that therapist are unable to dx, however, he most likely has lymphedema and the skin discoloration is due to the induration strangling the skin cells.  Explained that we do not have an order for lymphedema, therefore we will only be treating the edema in his LT leg and working on improving his skin health.  Therapist suggested compression t-shirts, compression underwear and biker shorts to attempt to decrease edema in abdominal and testicle area.  Therapist gave pt exercises to improve lymphatic circulation.  Person  educated: Patient Education method: Explanation, Verbal cues, and Handouts Education comprehension: verbalized understanding  HOME EXERCISE PROGRAM: Access Code: [email or web address] URL: [email or web address] Date: 07/10/2024 Prepared by: Montie Metro  Exercises - Seated Diaphragmatic Breathing  - 1 x daily - 7 x weekly - 10 reps - 5 hold - Seated Cervical Sidebending AROM  - 1 x daily - 7 x weekly - 1 sets - 10 reps - 2-3 hold - Seated Cervical Rotation AROM  - 1 x daily - 7 x weekly - 1 sets - 10 reps - 2-3 hold - Seated Cervical Extension AROM  - 1 x daily - 7 x weekly - 1 sets - 10 reps - 2-3 hold - Seated Cervical Retraction  - 1 x daily - 7 x weekly - 1 sets - 10 reps - 2-3 hold - Shoulder Rolls in Sitting  - 1 x daily - 7 x weekly - 1 sets - 10 reps - 2-3 hold - Seated Sidebending Arms Overhead  - 1 x daily - 7 x weekly - 1 sets - 10 reps - 2-3 hold - Seated March  - 1 x  daily - 7 x weekly - 1 sets - 10 reps - 2-3 hold - Seated Hip Abduction  - 1 x daily - 7 x weekly - 1 sets - 10 reps - 2-3 hold - Seated Long Arc Quad  - 1 x daily - 7 x weekly - 1 sets - 10 reps - 2-3 hold - Seated Ankle Dorsiflexion AROM  - 1 x daily - 7 x weekly - 1 sets - 10 reps - 2-3 hold - Seated Toe Curl  - 1 x daily - 7 x weekly - 1 sets - 10 reps - 2-3 hold  ASSESSMENT:  CLINICAL IMPRESSION: Pt returns today with dressing removed on LT LE.  Noted dryness of skin bilateral LE's.  Educated on importance of keeping LE's moisturized to prevent cracks, skin breakdown and possible cellulitis.  Measured bil LE with noted reduction in Lt LE as compared to evaluation.  Moisturized bil LE well, short manual completed and re-bandaging to Lt LE using regular profore system. Pt with most induration posterior LE.  Discussed donning of compression garment and states his daughter lives with him and will be doing it for him.  Pt reported overall comfort of bandaging.      Evaluation:  Patient is a 63 y.o. male who was seen today for physical therapy evaluation and treatment for T14.8XXA (ICD-10-CM) - Other injury of unspecified body .region, initial encounter. Upon examining the pt it is noted that he has increased induration in his abdominal, testicle and LE area. There is skin discoloration of his Lt LE which is why he is here.  He has noted increased redness in the posterior aspect of his leg and his skin is dry and flaking.  Mr. Porcaro most likely has lymphedema.  We will work on decreasing his LE edema, have him complete exercises to attempt to increase his lymphatic circulation and ultimately attempt to get the pt juxtalite B as well as an abdominal/LE pump.  The pt has been elevating his legs since July without improvement.  Mr. Hopfensperger will benefit from skilled therapy to decrease his edema to improve his skin health to avoid cellulitis.  If following this episode the pt desires to have full  lymphedema therapy he will need to obtain a lymphedema referral from his provider.  OBJECTIVE IMPAIRMENTS: decreased mobility, difficulty walking, increased edema, and obesity.   ACTIVITY  LIMITATIONS: stairs, bed mobility, and locomotion level  PERSONAL FACTORS: Fitness are also affecting patient's functional outcome.   REHAB POTENTIAL: Good  CLINICAL DECISION MAKING: Evolving/moderate complexity  EVALUATION COMPLEXITY: Moderate  GOALS: Goals reviewed with patient? Yes  SHORT TERM GOALS: Target date: 07/31/24  PT to be completing exercises to improve lymphatic circulation  Baseline: Goal status: INITIAL  2.  Pt skin to no longer be dry to reduce risk of cellulitis Baseline:  Goal status: INITIAL  3.  Pt size at measured intakes to be decreased by 2 cm to decrease risk of cellulitis  Baseline:  Goal status: INITIAL   LONG TERM GOALS: Target date: 08/29/23 extended due to holidays   Pt size at measured intakes to be decreased by 4 cm to decrease risk of cellulitis Baseline:  Goal status: INITIAL  2.  PT discoloration to be decreased by 75% to show improved skin health.  Baseline:  Goal status: INITIAL  3.  PT to note that it is easier to raise Lt LE onto the bed .  Baseline:  Goal status: INITIAL   PLAN:  PT FREQUENCY: 2x/week  PT DURATION: 7 weeks  PLANNED INTERVENTIONS: 97110-Therapeutic exercises, 97535- Self Care, 02859- Manual therapy, and Patient/Family education  PLAN FOR NEXT SESSION:  Follow up with fit of compression garment ordered from [employer].  Continue with decongestive techniques and compression bandaging.    Greig KATHEE Fuse, PTA/CLT [facility] Ph: [phone]  07/14/2024, 12:05 PM

## 2024-07-16 ENCOUNTER — Ambulatory Visit (HOSPITAL_COMMUNITY): Admitting: Physical Therapy

## 2024-07-16 DIAGNOSIS — I890 Lymphedema, not elsewhere classified: Secondary | ICD-10-CM | POA: Diagnosis not present

## 2024-07-16 NOTE — Therapy (Signed)
 OUTPATIENT PHYSICAL THERAPY LYMPHEDEMA TREATMENT  Patient Name: Jesse Meyers MRN: [id number] DOB:12-21-1960, 63 y.o., male Today's Date: 07/16/2024 PHYSICAL THERAPY DISCHARGE SUMMARY  Visits from Start of Care: 3  Current functional level related to goals / functional outcomes: Significant reduction of edema in Lt LE.  See below    Remaining deficits: Continued edema in thigh, testicle and abdominal area.     Education / Equipment: Pt was sent for wound care pt has no wound.  In the future he would benefit from lymphedema treatment if he so desires.    Patient agrees to discharge. Patient goals were partially met. Patient is being discharged due to being pleased with the current functional level.  END OF SESSION:  PT End of Session - 07/16/24 1201     Visit Number 3    Number of Visits 8    Date for Recertification  08/09/24    Authorization Type [employer]- auth placed    PT Start Time 1124    PT Stop Time 1155    PT Time Calculation (min) 31 min    Activity Tolerance Patient tolerated treatment well    Behavior During Therapy WFL for tasks assessed/performed           Past Medical History:  Diagnosis Date   Allergic rhinitis    Allergic rhinitis due to pollen    Chronic back pain 1993 &1995   s/p back surgery   Edema    GERD (gastroesophageal reflux disease)    Hemorrhoids    Hiatal hernia    Hyperglycemia    Hypertension    Morbid obesity (HCC)    Unsteady gait    Past Surgical History:  Procedure Laterality Date   BACK SURGERY  1993 &1995    Dr Gaither   Patient Active Problem List   Diagnosis Date Noted   Chronic low back pain 04/27/2015   OBESITY, MORBID 03/19/2009   GERD 03/19/2009    PCP: Joeann Browning, FNP  REFERRING PROVIDER: Joeann Browning, FNP  REFERRING DIAG: T14.8XXA (ICD-10-CM) - Other injury of unspecified body region, initial encounter  THERAPY DIAG:  lymphedema  Rationale for Evaluation and Treatment: Rehabilitation  ONSET  DATE: 02/09/24  SUBJECTIVE:                                                                                                                                                                                           SUBJECTIVE STATEMENT:  PT is very pleased with reduction of LE.  PT states that his compression will be here at 1:00.  Not interested in lymphedema treatment at this time.  PERTINENT HISTORY: Pt has  noted edema in abdominal, testicle and LE   PAIN:  Are you having pain? No PRECAUTIONS: Other: cellulitis     WEIGHT BEARING RESTRICTIONS: No  FALLS:  Has patient fallen in last 6 months? No  LIVING ENVIRONMENT: Lives with: lives with their family Lives in: House/apartment OCCUPATION: disabled   PRIOR LEVEL OF FUNCTION: Independent with basic ADLs  PATIENT GOALS: less swelling, discoloration to go away.    OBJECTIVE: Note: Objective measures were completed at Evaluation unless otherwise noted.  COGNITION: Overall cognitive status: Within functional limits for tasks assessed   PALPATION: Noted induration in abdominal and LE B; pt verbalized testicle edema    POSTURE: significant protruding abdominal   LYMPHEDEMA ASSESSMENTS:    (Blank rows = not tested)   LE LANDMARK RIGHT eval Right 07/14/24 Right 07/16/24  Around trunk at umbilicus     Around trunk at widest area of hips     At groin     30 cm proximal to suprapatella     20 cm proximal to suprapatella     10 cm proximal to suprapatella     At midpatella / popliteal crease     30 cm proximal to floor at lateral plantar foot  48 48  20 cm proximal to floor at lateral plantar foot  37.8 39.8  10 cm proximal to floor at lateral plantar foot  31 32.5  Circumference of ankle/heel  26.7   5 cm proximal to 1st MTP joint  27   Across MTP joint  25.5   Around proximal great toe  8.2   (Blank rows = not tested)   LE LANDMARK LEFT eval left 07/14/24 Left 07/16/24  Around trunk at umbilicus      Around  trunk at widest area of hips      At groin      30 cm proximal to suprapatella      20 cm proximal to suprapatella      10 cm proximal to suprapatella      At midpatella / popliteal crease      30 cm proximal to floor at lateral plantar foot  58 56.8 55  20 cm proximal to floor at lateral plantar foot  55.4 47.4 46.5  10 cm proximal to floor at lateral plantar foot  39.5 37 35.8  Circumference of ankle/heel   39   5 cm proximal to 1st MTP joint   27.5   Across MTP joint   25.6   Around proximal great toe   8.3       TODAY'S TREATMENT:                                                                                                                              DATE:  07/16/24 Education on keeping LE's moisturized, completing the exercises and wearing the compression garment on daily.  Pt was given measurement for Rt LE as well and instructed to order  compression garment for the Rt as well.   Manual to Lt LE Moisturized bil LE Only placed coban on Lt LE due to compression garment coming in an hour.   PATIENT EDUCATION:  Education details: Educated pt that therapist are unable to dx, however, he most likely has lymphedema and the skin discoloration is due to the induration strangling the skin cells.  Explained that we do not have an order for lymphedema, therefore we will only be treating the edema in his LT leg and working on improving his skin health.  Therapist suggested compression t-shirts, compression underwear and biker shorts to attempt to decrease edema in abdominal and testicle area.  Therapist gave pt exercises to improve lymphatic circulation.  Person educated: Patient Education method: Explanation, Verbal cues, and Handouts Education comprehension: verbalized understanding  HOME EXERCISE PROGRAM: Access Code: [email or web address] URL: [email or web address] Date: 07/10/2024 Prepared by: Montie Metro  Exercises - Seated Diaphragmatic Breathing  - 1 x daily - 7 x  weekly - 10 reps - 5 hold - Seated Cervical Sidebending AROM  - 1 x daily - 7 x weekly - 1 sets - 10 reps - 2-3 hold - Seated Cervical Rotation AROM  - 1 x daily - 7 x weekly - 1 sets - 10 reps - 2-3 hold - Seated Cervical Extension AROM  - 1 x daily - 7 x weekly - 1 sets - 10 reps - 2-3 hold - Seated Cervical Retraction  - 1 x daily - 7 x weekly - 1 sets - 10 reps - 2-3 hold - Shoulder Rolls in Sitting  - 1 x daily - 7 x weekly - 1 sets - 10 reps - 2-3 hold - Seated Sidebending Arms Overhead  - 1 x daily - 7 x weekly - 1 sets - 10 reps - 2-3 hold - Seated March  - 1 x daily - 7 x weekly - 1 sets - 10 reps - 2-3 hold - Seated Hip Abduction  - 1 x daily - 7 x weekly - 1 sets - 10 reps - 2-3 hold - Seated Long Arc Quad  - 1 x daily - 7 x weekly - 1 sets - 10 reps - 2-3 hold - Seated Ankle Dorsiflexion AROM  - 1 x daily - 7 x weekly - 1 sets - 10 reps - 2-3 hold - Seated Toe Curl  - 1 x daily - 7 x weekly - 1 sets - 10 reps - 2-3 hold  ASSESSMENT:  CLINICAL IMPRESSION: Pt very pleased with reduction of discoloration and decrease in size of pt Lt LE.  Compression garment will be here today.  Pt, at this time, is not interested in lymphedema treatment therefore we will discharge at this time.     OBJECTIVE IMPAIRMENTS: decreased mobility, difficulty walking, increased edema, and obesity.   ACTIVITY LIMITATIONS: stairs, bed mobility, and locomotion level  PERSONAL FACTORS: Fitness are also affecting patient's functional outcome.   REHAB POTENTIAL: Good  CLINICAL DECISION MAKING: Evolving/moderate complexity  EVALUATION COMPLEXITY: Moderate  GOALS: Goals reviewed with patient? Yes  SHORT TERM GOALS: Target date: 07/31/24  PT to be completing exercises to improve lymphatic circulation  Baseline: Goal status: met  2.  Pt skin to no longer be dry to reduce risk of cellulitis Baseline:  Goal status: on going   3.  Pt size at measured intakes to be decreased by 2 cm to  decrease risk of cellulitis  Baseline:  Goal status: met   LONG TERM GOALS: Target  date: 08/29/23 extended due to holidays   Pt size at measured intakes to be decreased by 4 cm to decrease risk of cellulitis Baseline:  Goal status: met  2.  PT discoloration to be decreased by 75% to show improved skin health.  Baseline:  Goal status: on-going   3.  PT to note that it is easier to raise Lt LE onto the bed .  Baseline:  Goal status: met   PLAN:  PT FREQUENCY: 2x/week  PT DURATION: 7 weeks  PLANNED INTERVENTIONS: 97110-Therapeutic exercises, 97535- Self Care, 02859- Manual therapy, and Patient/Family education  PLAN FOR NEXT SESSION:  Pt compression garment will be here today at 1:00.  Pt is ready for discharge.   Montie Metro, PT CLT [phone]  [facility] Ph: [phone]  07/16/2024, 12:14 PM

## 2024-07-21 ENCOUNTER — Ambulatory Visit (HOSPITAL_COMMUNITY)

## 2024-07-24 ENCOUNTER — Ambulatory Visit (HOSPITAL_COMMUNITY)

## 2024-07-29 ENCOUNTER — Ambulatory Visit (HOSPITAL_COMMUNITY)

## 2024-08-05 ENCOUNTER — Ambulatory Visit (HOSPITAL_COMMUNITY): Admitting: Physical Therapy

## 2024-08-08 ENCOUNTER — Ambulatory Visit (HOSPITAL_COMMUNITY): Admitting: Physical Therapy

## 2024-08-12 ENCOUNTER — Ambulatory Visit (HOSPITAL_COMMUNITY): Admitting: Physical Therapy

## 2024-08-14 ENCOUNTER — Ambulatory Visit (HOSPITAL_COMMUNITY): Admitting: Physical Therapy

## 2024-09-11 ENCOUNTER — Encounter: Payer: Self-pay | Admitting: *Deleted

## 2024-09-11 NOTE — Progress Notes (Signed)
 JANICE SEALES                                          MRN: [id number]   09/11/2024   The [facility] Quality Team Specialist reviewed this patient medical record for the purposes of chart review for care gap closure. The following were reviewed: chart review for care gap closure-controlling blood pressure.    [facility] Quality Team
